# Patient Record
Sex: Male | Born: 1990 | Race: Black or African American | Hispanic: No | Marital: Single
Health system: Midwestern US, Community
[De-identification: ages and names within clinical notes are randomized; demographics above are authoritative.]

## PROBLEM LIST (undated history)

## (undated) DIAGNOSIS — G809 Cerebral palsy, unspecified: Secondary | ICD-10-CM

---

## 2009-10-29 ENCOUNTER — Emergency Department (HOSPITAL_BASED_OUTPATIENT_CLINIC_OR_DEPARTMENT_OTHER): Admission: EM | Admit: 2009-10-29 | Discharge: 2009-10-29 | Payer: Self-pay | Admitting: Emergency Medicine

## 2010-03-16 LAB — RAPID STREP SCREEN (MED CTR MEBANE ONLY): Streptococcus, Group A Screen (Direct): NEGATIVE

## 2010-08-21 ENCOUNTER — Emergency Department (HOSPITAL_BASED_OUTPATIENT_CLINIC_OR_DEPARTMENT_OTHER)
Admission: EM | Admit: 2010-08-21 | Discharge: 2010-08-21 | Disposition: A | Discharge status: Home / Self Care | Payer: Self-pay | Attending: Emergency Medicine | Admitting: Emergency Medicine

## 2010-08-21 ENCOUNTER — Encounter: Payer: Self-pay | Admitting: *Deleted

## 2010-08-21 ENCOUNTER — Emergency Department (INDEPENDENT_AMBULATORY_CARE_PROVIDER_SITE_OTHER): Payer: Self-pay

## 2010-08-21 DIAGNOSIS — R05 Cough: Secondary | ICD-10-CM | POA: Insufficient documentation

## 2010-08-21 DIAGNOSIS — F17200 Nicotine dependence, unspecified, uncomplicated: Secondary | ICD-10-CM | POA: Insufficient documentation

## 2010-08-21 DIAGNOSIS — J45909 Unspecified asthma, uncomplicated: Secondary | ICD-10-CM | POA: Insufficient documentation

## 2010-08-21 DIAGNOSIS — R059 Cough, unspecified: Secondary | ICD-10-CM | POA: Insufficient documentation

## 2010-08-21 MED ORDER — PREDNISONE 20 MG PO TABS
ORAL_TABLET | ORAL | Status: AC
  Administered 2010-08-21: 60 mg via ORAL
  Filled 2010-08-21: qty 3

## 2010-08-21 MED ORDER — ALBUTEROL SULFATE HFA 108 (90 BASE) MCG/ACT IN AERS
2.0000 | INHALATION_SPRAY | Freq: Once | RESPIRATORY_TRACT | Status: AC
  Administered 2010-08-21: 2 via RESPIRATORY_TRACT
  Filled 2010-08-21: qty 6.7

## 2010-08-21 MED ORDER — ALBUTEROL SULFATE (5 MG/ML) 0.5% IN NEBU
5.0000 mg | INHALATION_SOLUTION | RESPIRATORY_TRACT | Status: DC
  Administered 2010-08-21: 5 mg via RESPIRATORY_TRACT
  Filled 2010-08-21: qty 1

## 2010-08-21 MED ORDER — PREDNISONE 20 MG PO TABS
60.0000 mg | ORAL_TABLET | Freq: Every day | ORAL | Status: DC
  Administered 2010-08-21: 60 mg via ORAL

## 2010-08-21 MED ORDER — IPRATROPIUM BROMIDE 0.02 % IN SOLN
0.5000 mg | RESPIRATORY_TRACT | Status: DC
  Administered 2010-08-21: 0.5 mg via RESPIRATORY_TRACT
  Filled 2010-08-21: qty 2.5

## 2010-08-21 NOTE — ED Provider Notes (Signed)
History     CSN: [id number] Arrival date & time: 08/21/2010 11:47 AM  Chief Complaint  Patient presents with  . Cough   HPI Patient is a 20 year old male comes in today complaining of cough for week. He states he has had some sputum productive with green color. He is a smoker and smokes between about 5 cigarettes per day. He endorses some dyspnea upon questioning. He has not had any fever or chills. He has no history of asthma in the past. History reviewed. No pertinent past medical history.  History reviewed. No pertinent past surgical history.  History reviewed. No pertinent family history.  History  Substance Use Topics  . Smoking status: Current Everyday Smoker -- 0.2 packs/day    Types: Cigarettes  . Smokeless tobacco: Not on file  . Alcohol Use: Yes     maybe twice a year.     review  Review of Systems  All other systems reviewed and are negative.    Physical Exam  BP 147/87  Pulse 70  Temp(Src) 98.1 F (36.7 C) (Oral)  Resp 20  Ht 5\' 8"  (1.727 m)  Wt 243 lb (110.224 kg)  BMI 36.95 kg/m2  SpO2 95% Obese black male sitting on the bed in no acute distress his systolic blood pressure is slightly elevated at 147/87 he is afebrile his respiratory rate is 20. His heart rate is normal at 70. HEENT normocephalic atraumatic eyes are equal round Extraocular movements are intact. Ears tympanic membranes are normal bilaterally. Nares are patent with no discharge noted. Oropharynx-mucous membranes are moist. Oropharynx is clear no erythema or exudate is noted. Neck-trachea is midline. Carotid pulses are normal bilaterally. Neck is supple. There is no lymphadenopathy noted. Chest wall it-normal. Lungs-mild diffuse expiratory wheezing throughout. No rhonchi are noted.  Heart-regular rate and rhythm no murmurs or gallops are appreciated. Abdomen is soft obese and nontender. Extremities show no signs of edema or redness. Physical Exam  ED Course   Procedures  MDM Patient is given albuterol and Atrovent. He has decreased in wheezing and coughing. His chest x-Jedi Catalfamo does not show any focal infiltrate. He will be given prednisone and albuterol inhaler here. He is instructed to use this 2 puffs every 4 hours. He'll be given a prescription for prednisone. He is advised of the dangers of smoking and to stop smoking cigarettes.      Hilario Quarry, MD 08/24/10 [phone]

## 2010-08-21 NOTE — ED Notes (Signed)
Albuterol 2puffs given at this time using aerochamber.  Pt demostrates proper use.  RX label filled out and attached.

## 2010-08-21 NOTE — ED Notes (Signed)
Patient states he is coughing and it looks green with no known fever x 1 1/2 week and with some SOB. No acute respiratory distress notes at this time.

## 2011-08-19 ENCOUNTER — Emergency Department (HOSPITAL_BASED_OUTPATIENT_CLINIC_OR_DEPARTMENT_OTHER)
Admission: EM | Admit: 2011-08-19 | Discharge: 2011-08-19 | Disposition: A | Discharge status: Home / Self Care | Payer: Self-pay | Attending: Emergency Medicine | Admitting: Emergency Medicine

## 2011-08-19 ENCOUNTER — Encounter (HOSPITAL_BASED_OUTPATIENT_CLINIC_OR_DEPARTMENT_OTHER): Payer: Self-pay | Admitting: Emergency Medicine

## 2011-08-19 ENCOUNTER — Emergency Department (HOSPITAL_BASED_OUTPATIENT_CLINIC_OR_DEPARTMENT_OTHER): Payer: Self-pay

## 2011-08-19 DIAGNOSIS — M25569 Pain in unspecified knee: Secondary | ICD-10-CM | POA: Insufficient documentation

## 2011-08-19 DIAGNOSIS — G809 Cerebral palsy, unspecified: Secondary | ICD-10-CM | POA: Insufficient documentation

## 2011-08-19 DIAGNOSIS — Z880 Allergy status to penicillin: Secondary | ICD-10-CM | POA: Insufficient documentation

## 2011-08-19 HISTORY — DX: Cerebral palsy, unspecified: G80.9

## 2011-08-19 MED ORDER — IBUPROFEN 800 MG PO TABS
800.0000 mg | ORAL_TABLET | Freq: Once | ORAL | Status: AC
  Administered 2011-08-19: 800 mg via ORAL
  Filled 2011-08-19: qty 1

## 2011-08-19 MED ORDER — IBUPROFEN 800 MG PO TABS: 800.0000 mg | ORAL_TABLET | Freq: Three times a day (TID) | ORAL | Status: AC | PRN

## 2011-08-19 NOTE — ED Notes (Signed)
Pt states he has chronic right knee pain but has been worse in last few days.  No known injury.

## 2011-08-19 NOTE — ED Provider Notes (Addendum)
History     CSN: [id number]  Arrival date & time 08/19/11  1046   First MD Initiated Contact with Patient 08/19/11 1059      Chief Complaint  Patient presents with  . Knee Pain    (Consider location/radiation/quality/duration/timing/severity/associated sxs/prior treatment) HPI Pt with a history of mild cerebral palsy reports occasional aching pain in R leg. Since last night when he sat in a cross-legged position for a while, he has had moderate aching pain in R knee, worse with movement. No falls or trauma.  Past Medical History  Diagnosis Date  . Cerebral palsy     No past surgical history on file.  No family history on file.  History  Substance Use Topics  . Smoking status: Current Everyday Smoker -- 0.2 packs/day    Types: Cigarettes  . Smokeless tobacco: Not on file  . Alcohol Use: Yes     maybe twice a year.      Review of Systems All other systems reviewed and are negative except as noted in HPI.   Allergies  Penicillins  Home Medications  No current outpatient prescriptions on file.  BP 134/74  Pulse 71  Temp 97.8 F (36.6 C) (Oral)  Resp 18  SpO2 100%  Physical Exam  Nursing note and vitals reviewed. Constitutional: He is oriented to person, place, and time. He appears well-developed and well-nourished.  HENT:  Head: Normocephalic and atraumatic.  Eyes: EOM are normal. Pupils are equal, round, and reactive to light.  Neck: Normal range of motion. Neck supple.  Cardiovascular: Normal rate, normal heart sounds and intact distal pulses.   Pulmonary/Chest: Effort normal and breath sounds normal.  Abdominal: Bowel sounds are normal. He exhibits no distension. There is no tenderness.  Musculoskeletal: He exhibits tenderness. He exhibits no edema.       Pain in R knee, worse with ROM, decreased flexion, normal extension, no instability  Neurological: He is alert and oriented to person, place, and time. He has normal strength. No cranial nerve deficit  or sensory deficit.  Skin: Skin is warm and dry. No rash noted.  Psychiatric: He has a normal mood and affect.    ED Course  Procedures (including critical care time)  Labs Reviewed - No data to display Dg Knee Complete 4 Views Right  08/19/2011  *RADIOLOGY REPORT*  Clinical Data: Pain  RIGHT KNEE - COMPLETE 4+ VIEW  Comparison: None.  Findings: No acute fracture and no dislocation.  Unremarkable soft tissues.  IMPRESSION: No acute bony pathology.  Original Report Authenticated By: Donavan Burnet, M.D.     No diagnosis found.    MDM  Xray neg as above. Will give knee sleeve, pain medications as needed, [place] followup if needed.        Sindy Mccune B. Bernette Mayers, MD 08/19/11 1155

## 2013-05-19 ENCOUNTER — Emergency Department (HOSPITAL_BASED_OUTPATIENT_CLINIC_OR_DEPARTMENT_OTHER)
Admission: EM | Admit: 2013-05-19 | Discharge: 2013-05-20 | Disposition: A | Discharge status: Home / Self Care | Payer: BC Managed Care – PPO | Attending: Emergency Medicine | Admitting: Emergency Medicine

## 2013-05-19 ENCOUNTER — Encounter (HOSPITAL_BASED_OUTPATIENT_CLINIC_OR_DEPARTMENT_OTHER): Payer: Self-pay | Admitting: Emergency Medicine

## 2013-05-19 DIAGNOSIS — K219 Gastro-esophageal reflux disease without esophagitis: Secondary | ICD-10-CM

## 2013-05-19 DIAGNOSIS — Z880 Allergy status to penicillin: Secondary | ICD-10-CM | POA: Insufficient documentation

## 2013-05-19 DIAGNOSIS — F17200 Nicotine dependence, unspecified, uncomplicated: Secondary | ICD-10-CM | POA: Insufficient documentation

## 2013-05-19 DIAGNOSIS — Z8669 Personal history of other diseases of the nervous system and sense organs: Secondary | ICD-10-CM | POA: Insufficient documentation

## 2013-05-19 MED ORDER — GI COCKTAIL ~~LOC~~
30.0000 mL | Freq: Once | ORAL | Status: AC
  Administered 2013-05-20: 30 mL via ORAL
  Filled 2013-05-19: qty 30

## 2013-05-19 NOTE — ED Provider Notes (Signed)
CSN: [id number]     Arrival date & time 05/19/13  2115 History   First MD Initiated Contact with Patient 05/19/13 2341     Chief Complaint  Patient presents with  . Gastrophageal Reflux     (Consider location/radiation/quality/duration/timing/severity/associated sxs/prior Treatment) Patient is a 23 y.o. male presenting with GERD. The history is provided by the patient. No language interpreter was used.  Gastrophageal Reflux This is a new problem. The current episode started today. Pertinent negatives include no chest pain, congestion, fever, nausea, sore throat or vomiting. Associated symptoms comments: He has a burning sensation in his throat with a sense of swelling. He denies sore throat, fever, congestion. He states the sensation is constant but worse when he eats anything. . The symptoms are aggravated by eating.    Past Medical History  Diagnosis Date  . Cerebral palsy    History reviewed. No pertinent past surgical history. History reviewed. No pertinent family history. History  Substance Use Topics  . Smoking status: Current Every Day Smoker -- 0.20 packs/day    Types: Cigarettes  . Smokeless tobacco: Not on file  . Alcohol Use: Yes     Comment: maybe twice a year.    Review of Systems  Constitutional: Negative for fever.  HENT: Negative for congestion and sore throat.        See HPI.  Respiratory: Negative for shortness of breath.   Cardiovascular: Negative for chest pain.  Gastrointestinal: Negative for nausea and vomiting.      Allergies  Penicillins  Home Medications   Prior to Admission medications   Not on File   BP 127/79  Pulse 74  Temp(Src) 98.6 F (37 C) (Oral)  Resp 18  Ht 5\' 7"  (1.702 m)  Wt 243 lb (110.224 kg)  BMI 38.05 kg/m2  SpO2 98% Physical Exam  Constitutional: He is oriented to person, place, and time. He appears well-developed and well-nourished. No distress.  HENT:  Mouth/Throat: Oropharynx is clear and moist.  Neck: Normal  range of motion. No thyromegaly present.  Pulmonary/Chest: Effort normal. He has no wheezes. He has no rales. He exhibits no tenderness.  Abdominal: Soft. There is no tenderness. There is no rebound.  Neurological: He is alert and oriented to person, place, and time.    ED Course  Procedures (including critical care time) Labs Review Labs Reviewed - No data to display  Imaging Review No results found.   EKG Interpretation   Date/Time:  Monday May 19 2013 21:26:52 EDT Ventricular Rate:  70 PR Interval:  146 QRS Duration: 90 QT Interval:  368 QTC Calculation: 397 R Axis:   39 Text Interpretation:  Normal sinus rhythm with sinus arrhythmia Normal ECG  No old tracing to compare Confirmed by BELFI  MD, MELANIE ([id number]) on  05/19/2013 10:12:03 PM      MDM   Final diagnoses:  None    1. GERD  He is feeling better with GI Cocktail. No fever, abdomen non-tender, lungs clear. Doubt acute process. Will treat with Prilosec and encourage PCP follow up.    Arnoldo HookerShari A Clifford Coudriet, PA-C 05/20/13 [phone]

## 2013-05-19 NOTE — ED Notes (Signed)
C/o burning from chest to throat since 2 pm, denies diaphoresis, no N/V, no SOB

## 2013-05-20 MED ORDER — OMEPRAZOLE 20 MG PO CPDR: DELAYED_RELEASE_CAPSULE | ORAL | Status: DC

## 2013-05-20 NOTE — ED Provider Notes (Signed)
Medical screening examination/treatment/procedure(s) were performed by non-physician practitioner and as supervising physician I was immediately available for consultation/collaboration.   EKG Interpretation   Date/Time:  Monday May 19 2013 21:26:52 EDT Ventricular Rate:  70 PR Interval:  146 QRS Duration: 90 QT Interval:  368 QTC Calculation: 397 R Axis:   39 Text Interpretation:  Normal sinus rhythm with sinus arrhythmia Normal ECG  No old tracing to compare Confirmed by BELFI  MD, MELANIE ([id number]) on  05/19/2013 10:12:03 PM       Dale Ribeiro Smitty CordsK Dulce Martian-Rasch, MD 05/20/13 [id number]

## 2013-05-20 NOTE — Discharge Instructions (Signed)
Diet for Gastroesophageal Reflux Disease, Adult Reflux (acid reflux) is when acid from your stomach flows up into the esophagus. When acid comes in contact with the esophagus, the acid causes irritation and soreness (inflammation) in the esophagus. When reflux happens often or so severely that it causes damage to the esophagus, it is called gastroesophageal reflux disease (GERD). Nutrition therapy can help ease the discomfort of GERD. FOODS OR DRINKS TO AVOID OR LIMIT  Smoking or chewing tobacco. Nicotine is one of the most potent stimulants to acid production in the gastrointestinal tract.  Caffeinated and decaffeinated coffee and black tea.  Regular or low-calorie carbonated beverages or energy drinks (caffeine-free carbonated beverages are allowed).   Strong spices, such as black pepper, white pepper, red pepper, cayenne, curry powder, and chili powder.  Peppermint or spearmint.  Chocolate.  High-fat foods, including meats and fried foods. Extra added fats including oils, butter, salad dressings, and nuts. Limit these to less than 8 tsp per day.  Fruits and vegetables if they are not tolerated, such as citrus fruits or tomatoes.  Alcohol.  Any food that seems to aggravate your condition. If you have questions regarding your diet, call your caregiver or a registered dietitian. OTHER THINGS THAT MAY HELP GERD INCLUDE:   Eating your meals slowly, in a relaxed setting.  Eating 5 to 6 small meals per day instead of 3 large meals.  Eliminating food for a period of time if it causes distress.  Not lying down until 3 hours after eating a meal.  Keeping the head of your bed raised 6 to 9 inches (15 to 23 cm) by using a foam wedge or blocks under the legs of the bed. Lying flat may make symptoms worse.  Being physically active. Weight loss may be helpful in reducing reflux in overweight or obese adults.  Wear loose fitting clothing EXAMPLE MEAL PLAN This meal plan is approximately  2,000 calories based on [employer] meal planning guidelines. Breakfast   cup cooked oatmeal.  1 cup strawberries.  1 cup low-fat milk.  1 oz almonds. Snack  1 cup cucumber slices.  6 oz yogurt (made from low-fat or fat-free milk). Lunch  2 slice whole-wheat bread.  2 oz sliced turkey.  2 tsp mayonnaise.  1 cup blueberries.  1 cup snap peas. Snack  6 whole-wheat crackers.  1 oz string cheese. Dinner   cup brown rice.  1 cup mixed veggies.  1 tsp olive oil.  3 oz grilled fish. Document Released: 12/19/2004 Document Revised: 03/13/2011 Document Reviewed: 11/04/2010 [employer] Patient Information 2014 [employer]. Gastroesophageal Reflux Disease, Adult Gastroesophageal reflux disease (GERD) happens when acid from your stomach flows up into the esophagus. When acid comes in contact with the esophagus, the acid causes soreness (inflammation) in the esophagus. Over time, GERD may create small holes (ulcers) in the lining of the esophagus. CAUSES   Increased body weight. This puts pressure on the stomach, making acid rise from the stomach into the esophagus.  Smoking. This increases acid production in the stomach.  Drinking alcohol. This causes decreased pressure in the lower esophageal sphincter (valve or ring of muscle between the esophagus and stomach), allowing acid from the stomach into the esophagus.  Late evening meals and a full stomach. This increases pressure and acid production in the stomach.  A malformed lower esophageal sphincter. Sometimes, no cause is found. SYMPTOMS   Burning pain in the lower part of the mid-chest behind the breastbone and in the mid-stomach area. This may   occur twice a week or more often.  Trouble swallowing.  Sore throat.  Dry cough.  Asthma-like symptoms including chest tightness, shortness of breath, or wheezing. DIAGNOSIS  Your caregiver may be able to diagnose GERD based on your symptoms. In some cases,  X-rays and other tests may be done to check for complications or to check the condition of your stomach and esophagus. TREATMENT  Your caregiver may recommend over-the-counter or prescription medicines to help decrease acid production. Ask your caregiver before starting or adding any new medicines.  HOME CARE INSTRUCTIONS   Change the factors that you can control. Ask your caregiver for guidance concerning weight loss, quitting smoking, and alcohol consumption.  Avoid foods and drinks that make your symptoms worse, such as:  Caffeine or alcoholic drinks.  Chocolate.  Peppermint or mint flavorings.  Garlic and onions.  Spicy foods.  Citrus fruits, such as oranges, lemons, or limes.  Tomato-based foods such as sauce, chili, salsa, and pizza.  Fried and fatty foods.  Avoid lying down for the 3 hours prior to your bedtime or prior to taking a nap.  Eat small, frequent meals instead of large meals.  Wear loose-fitting clothing. Do not wear anything tight around your waist that causes pressure on your stomach.  Raise the head of your bed 6 to 8 inches with wood blocks to help you sleep. Extra pillows will not help.  Only take over-the-counter or prescription medicines for pain, discomfort, or fever as directed by your caregiver.  Do not take aspirin, ibuprofen, or other nonsteroidal anti-inflammatory drugs (NSAIDs). SEEK IMMEDIATE MEDICAL CARE IF:   You have pain in your arms, neck, jaw, teeth, or back.  Your pain increases or changes in intensity or duration.  You develop nausea, vomiting, or sweating (diaphoresis).  You develop shortness of breath, or you faint.  Your vomit is green, yellow, black, or looks like coffee grounds or blood.  Your stool is red, bloody, or black. These symptoms could be signs of other problems, such as heart disease, gastric bleeding, or esophageal bleeding. MAKE SURE YOU:   Understand these instructions.  Will watch your  condition.  Will get help right away if you are not doing well or get worse. Document Released: 09/28/2004 Document Revised: 03/13/2011 Document Reviewed: 07/08/2010 Exit[employer] Patient Information 2014 [employer].  

## 2014-06-26 ENCOUNTER — Encounter (HOSPITAL_BASED_OUTPATIENT_CLINIC_OR_DEPARTMENT_OTHER): Payer: Self-pay | Admitting: Emergency Medicine

## 2014-06-26 ENCOUNTER — Emergency Department (HOSPITAL_BASED_OUTPATIENT_CLINIC_OR_DEPARTMENT_OTHER)
Admission: EM | Admit: 2014-06-26 | Discharge: 2014-06-26 | Disposition: A | Discharge status: Home / Self Care | Payer: 59 | Attending: Emergency Medicine | Admitting: Emergency Medicine

## 2014-06-26 DIAGNOSIS — Z79899 Other long term (current) drug therapy: Secondary | ICD-10-CM | POA: Insufficient documentation

## 2014-06-26 DIAGNOSIS — Z880 Allergy status to penicillin: Secondary | ICD-10-CM | POA: Insufficient documentation

## 2014-06-26 DIAGNOSIS — J029 Acute pharyngitis, unspecified: Secondary | ICD-10-CM | POA: Diagnosis present

## 2014-06-26 DIAGNOSIS — Z8669 Personal history of other diseases of the nervous system and sense organs: Secondary | ICD-10-CM | POA: Insufficient documentation

## 2014-06-26 DIAGNOSIS — Z720 Tobacco use: Secondary | ICD-10-CM | POA: Insufficient documentation

## 2014-06-26 DIAGNOSIS — J020 Streptococcal pharyngitis: Secondary | ICD-10-CM | POA: Insufficient documentation

## 2014-06-26 DIAGNOSIS — R000 Tachycardia, unspecified: Secondary | ICD-10-CM | POA: Insufficient documentation

## 2014-06-26 LAB — RAPID STREP SCREEN (MED CTR MEBANE ONLY): Streptococcus, Group A Screen (Direct): POSITIVE — AB

## 2014-06-26 MED ORDER — IBUPROFEN 800 MG PO TABS
800.0000 mg | ORAL_TABLET | Freq: Once | ORAL | Status: AC
  Administered 2014-06-26: 800 mg via ORAL
  Filled 2014-06-26: qty 1

## 2014-06-26 MED ORDER — CLINDAMYCIN HCL 300 MG PO CAPS: 300.0000 mg | ORAL_CAPSULE | Freq: Three times a day (TID) | ORAL | Status: DC

## 2014-06-26 MED ORDER — CLINDAMYCIN HCL 150 MG PO CAPS
300.0000 mg | ORAL_CAPSULE | Freq: Once | ORAL | Status: AC
  Administered 2014-06-26: 300 mg via ORAL
  Filled 2014-06-26: qty 2

## 2014-06-26 MED ORDER — LIDOCAINE VISCOUS 2 % MT SOLN
15.0000 mL | Freq: Once | OROMUCOSAL | Status: AC
  Administered 2014-06-26: 15 mL via OROMUCOSAL
  Filled 2014-06-26: qty 15

## 2014-06-26 MED ORDER — IBUPROFEN 800 MG PO TABS: 800.0000 mg | ORAL_TABLET | Freq: Three times a day (TID) | ORAL | Status: DC

## 2014-06-26 NOTE — Discharge Instructions (Signed)
Take motrin for pain.  Take clindamycin three times daily for a week.   Stay hydrated.   Follow up with your doctor.   Return to ER if you have worse sore throat, fever, trouble swallowing.

## 2014-06-26 NOTE — ED Provider Notes (Signed)
CSN: [id number]     Arrival date & time 06/26/14  9381 History   First MD Initiated Contact with Patient 06/26/14 0515     Chief Complaint  Patient presents with  . Sore Throat     (Consider location/radiation/quality/duration/timing/severity/associated sxs/prior Treatment) The history is provided by the patient.  Grant Brown is a 24 y.o. male hx of cerebral palsy here with sore throat. Patient has been having sore throat for the last 2 days. Patient has subjective fevers as well. Took some TheraFlu and Motrin with minimal relief. Denies any cough. Has some intermittent headaches but denies any neck stiffness. Denies any sick contacts.   Past Medical History  Diagnosis Date  . Cerebral palsy    History reviewed. No pertinent past surgical history. No family history on file. History  Substance Use Topics  . Smoking status: Current Every Day Smoker -- 0.20 packs/day    Types: Cigarettes  . Smokeless tobacco: Not on file  . Alcohol Use: Yes     Comment: maybe twice a year.    Review of Systems  HENT: Positive for sore throat.   All other systems reviewed and are negative.     Allergies  Penicillins  Home Medications   Prior to Admission medications   Medication Sig Start Date End Date Taking? Authorizing Provider  omeprazole (PRILOSEC) 20 MG capsule Take one twice daily for the first 5 days then once daily after that 05/20/13   Melvenia Beam Upstill, PA-C   BP 147/85 mmHg  Pulse 108  Temp(Src) 99.6 F (37.6 C) (Oral)  Resp 20  Ht 5\' 7"  (1.702 m)  Wt 220 lb (99.791 kg)  BMI 34.45 kg/m2  SpO2 98% Physical Exam  Constitutional: He is oriented to person, place, and time.  Uncomfortable   HENT:  Head: Normocephalic.  OP red. No obvious tonsillar exudates   Eyes: Conjunctivae are normal. Pupils are equal, round, and reactive to light.  Neck:  Mild diffuse cervical LAD   Cardiovascular: Regular rhythm and normal heart sounds.   Mildly tachy   Pulmonary/Chest: Effort  normal and breath sounds normal. No respiratory distress. He has no wheezes. He has no rales.  Abdominal: Soft. Bowel sounds are normal. He exhibits no distension. There is no tenderness. There is no rebound and no guarding.  Musculoskeletal: Normal range of motion. He exhibits no edema or tenderness.  Neurological: He is alert and oriented to person, place, and time. No cranial nerve deficit. Coordination normal.  Skin: Skin is warm and dry.  Psychiatric: He has a normal mood and affect. His behavior is normal. Judgment and thought content normal.  Nursing note and vitals reviewed.   ED Course  Procedures (including critical care time) Labs Review Labs Reviewed  RAPID STREP SCREEN (NOT AT [facility]) - Abnormal; Notable for the following:    Streptococcus, Group A Screen (Direct) POSITIVE (*)    All other components within normal limits    Imaging Review No results found.   EKG Interpretation None      MDM   Final diagnoses:  None    Grant Brown is a 24 y.o. male here with sore throat. Low grade temp, has cervical LAD, strep positive. Will give clinda (allergic to pcn). No signs of RPA or PTA. Will dc home.     Richardean Canal, MD 06/26/14 [phone]

## 2014-06-26 NOTE — ED Notes (Signed)
Patient c/o sore throat x 2 days; states has been taking Theraflu and Motrin.  Also, c/o headaches.

## 2014-12-14 ENCOUNTER — Emergency Department (HOSPITAL_BASED_OUTPATIENT_CLINIC_OR_DEPARTMENT_OTHER): Payer: 59

## 2014-12-14 ENCOUNTER — Emergency Department (HOSPITAL_BASED_OUTPATIENT_CLINIC_OR_DEPARTMENT_OTHER)
Admission: EM | Admit: 2014-12-14 | Discharge: 2014-12-14 | Disposition: A | Discharge status: Home / Self Care | Payer: 59 | Attending: Emergency Medicine | Admitting: Emergency Medicine

## 2014-12-14 ENCOUNTER — Encounter (HOSPITAL_BASED_OUTPATIENT_CLINIC_OR_DEPARTMENT_OTHER): Payer: Self-pay

## 2014-12-14 DIAGNOSIS — Z8669 Personal history of other diseases of the nervous system and sense organs: Secondary | ICD-10-CM | POA: Diagnosis not present

## 2014-12-14 DIAGNOSIS — Z791 Long term (current) use of non-steroidal anti-inflammatories (NSAID): Secondary | ICD-10-CM | POA: Diagnosis not present

## 2014-12-14 DIAGNOSIS — M25572 Pain in left ankle and joints of left foot: Secondary | ICD-10-CM | POA: Diagnosis present

## 2014-12-14 DIAGNOSIS — F17210 Nicotine dependence, cigarettes, uncomplicated: Secondary | ICD-10-CM | POA: Diagnosis not present

## 2014-12-14 DIAGNOSIS — Z880 Allergy status to penicillin: Secondary | ICD-10-CM | POA: Diagnosis not present

## 2014-12-14 MED ORDER — PREDNISONE 50 MG PO TABS: 50.0000 mg | ORAL_TABLET | Freq: Every day | ORAL | Status: DC

## 2014-12-14 MED ORDER — TRAMADOL HCL 50 MG PO TABS: 50.0000 mg | ORAL_TABLET | Freq: Four times a day (QID) | ORAL | Status: DC | PRN

## 2014-12-14 NOTE — ED Provider Notes (Signed)
CSN: [id number]     Arrival date & time 12/14/14  1522 History   First MD Initiated Contact with Patient 12/14/14 1601     Chief Complaint  Patient presents with  . Ankle Pain     (Consider location/radiation/quality/duration/timing/severity/associated sxs/prior Treatment) HPI Patient presents to the emergency department with left ankle pain has been ongoing over the last 3 weeks.  They should states that he does stand and walk a lot at work.  He states that he has had strep quite a while and never had this trouble before.  Patient states that nothing seems make her condition better.  He notes the pain is worse when he first wakes up and gets better as the day goes on.  Patient states nothing seems make her condition better or worse.  Patient states that since take any medications prior to arrival for his symptoms.  Patient denies numbness or weakness.  Past Medical History  Diagnosis Date  . Cerebral palsy (HCC)    History reviewed. No pertinent past surgical history. No family history on file. Social History  Substance Use Topics  . Smoking status: Current Every Day Smoker -- 0.20 packs/day    Types: Cigarettes  . Smokeless tobacco: None  . Alcohol Use: Yes     Comment: occ    Review of Systems All other systems negative except as documented in the HPI. All pertinent positives and negatives as reviewed in the HPI.  Allergies  Penicillins  Home Medications   Prior to Admission medications   Medication Sig Start Date End Date Taking? Authorizing Provider  ibuprofen (ADVIL,MOTRIN) 800 MG tablet Take 1 tablet (800 mg total) by mouth 3 (three) times daily. 06/26/14   Richardean Canalavid H Yao, MD   BP 128/71 mmHg  Pulse 84  Temp(Src) 98.4 F (36.9 C) (Oral)  Resp 16  Ht 5\' 8"  (1.727 m)  Wt 102.059 kg  BMI 34.22 kg/m2  SpO2 98% Physical Exam  Constitutional: He is oriented to person, place, and time. He appears well-developed and well-nourished. No distress.  HENT:  Head:  Normocephalic and atraumatic.  Eyes: Pupils are equal, round, and reactive to light.  Pulmonary/Chest: Effort normal.  Musculoskeletal:       Left ankle: He exhibits normal range of motion, no swelling, no ecchymosis, no deformity, no laceration and normal pulse. No tenderness. Achilles tendon normal. Achilles tendon exhibits no pain, no defect and normal Thompson's test results.  Neurological: He is oriented to person, place, and time.  Skin: Skin is warm and dry. No rash noted. No erythema.    ED Course  Procedures (including critical care time) Labs Review Labs Reviewed - No data to display  Imaging Review Dg Ankle Complete Left  12/14/2014  CLINICAL DATA:  Medial ankle pain for 2 or 3 months. No known injury. EXAM: LEFT ANKLE COMPLETE - 3+ VIEW COMPARISON:  None. FINDINGS: The mineralization and alignment are normal. There is no evidence of acute fracture or dislocation. The joint spaces appear maintained. Ossific density projecting medially from the talus inferior to the medial malleolus on the oblique view is probably a small spur, not seen on the other views. Hydroxyapatite deposition considered less likely. No focal soft tissue swelling identified. IMPRESSION: No acute osseous findings. Probable spurring inferior to the medial malleolus; hydroxyapatite deposition less likely. Electronically Signed   By: Carey BullocksWilliam  Veazey M.D.   On: 12/14/2014 16:49   I have personally reviewed and evaluated these images and lab results as part of my medical  decision-making.  Patient is placed in an ASO and referred to orthopedics.  Told ice and elevate his ankle.  Told to return here as needed.  Patient agrees the plan and all questions were answered    Charlestine Night, PA-C 12/15/14 2250  Nelva Nay, MD 12/16/14 [phone]

## 2014-12-14 NOTE — ED Notes (Signed)
C/o left ankle pain x 2-3 weeks-denies injury-NAD-steady gait

## 2014-12-14 NOTE — Discharge Instructions (Signed)
Return here as needed. Follow up with the orthopedist provided. Ice and elevate the ankle. °

## 2015-09-05 ENCOUNTER — Encounter (HOSPITAL_BASED_OUTPATIENT_CLINIC_OR_DEPARTMENT_OTHER): Payer: Self-pay | Admitting: Emergency Medicine

## 2015-09-05 DIAGNOSIS — J039 Acute tonsillitis, unspecified: Secondary | ICD-10-CM | POA: Insufficient documentation

## 2015-09-05 DIAGNOSIS — F17210 Nicotine dependence, cigarettes, uncomplicated: Secondary | ICD-10-CM | POA: Insufficient documentation

## 2015-09-05 LAB — RAPID STREP SCREEN (MED CTR MEBANE ONLY): STREPTOCOCCUS, GROUP A SCREEN (DIRECT): NEGATIVE

## 2015-09-05 NOTE — ED Triage Notes (Signed)
Pt c/o sore throat and HA since Thursday

## 2015-09-06 ENCOUNTER — Emergency Department (HOSPITAL_BASED_OUTPATIENT_CLINIC_OR_DEPARTMENT_OTHER)
Admission: EM | Admit: 2015-09-06 | Discharge: 2015-09-06 | Disposition: A | Discharge status: Home / Self Care | Payer: BLUE CROSS/BLUE SHIELD | Attending: Emergency Medicine | Admitting: Emergency Medicine

## 2015-09-06 DIAGNOSIS — J039 Acute tonsillitis, unspecified: Secondary | ICD-10-CM

## 2015-09-06 MED ORDER — AZITHROMYCIN 250 MG PO TABS: 500.0000 mg | ORAL_TABLET | Freq: Every day | ORAL | 0 refills | Status: AC

## 2015-09-06 NOTE — ED Provider Notes (Signed)
  [facility]   CSN: [id number] Arrival date & time: 09/05/15  2228     History   Chief Complaint Chief Complaint  Patient presents with  . Sore Throat    HPI Grant Brown is a 25 y.o. male with a five-day history of sore throat. He rates his pain as moderate to severe, pain is worse with swallowing. He has had an associated headache and low-grade fever. He denies nasal congestion, shortness of breath, cough, abdominal pain, nausea or vomiting. He has had diarrhea.  HPI  Past Medical History:  Diagnosis Date  . Cerebral palsy (HCC)     There are no active problems to display for this patient.   History reviewed. No pertinent surgical history.     Home Medications    Prior to Admission medications   Medication Sig Start Date End Date Taking? Authorizing Provider  ibuprofen (ADVIL,MOTRIN) 800 MG tablet Take 1 tablet (800 mg total) by mouth 3 (three) times daily. 06/26/14   Charlynne Panderavid Hsienta Yao, MD    Family History History reviewed. No pertinent family history.  Social History Social History  Substance Use Topics  . Smoking status: Current Every Day Smoker    Packs/day: 0.20    Types: Cigarettes  . Smokeless tobacco: Never Used  . Alcohol use Yes     Comment: occ     Allergies   Penicillins   Review of Systems Review of Systems  All other systems reviewed and are negative.   Physical Exam Updated Vital Signs BP 100/61 (BP Location: Right Arm)   Pulse 80   Temp 97.9 F (36.6 C) (Oral)   Resp 18   Ht 5\' 8"  (1.727 m)   Wt 225 lb (102.1 kg)   SpO2 97%   BMI 34.21 kg/m   Physical Exam General: Well-developed, well-nourished male in no acute distress; appearance consistent with age of record HENT: normocephalic; atraumatic; tonsils erythematous, enlarged with exudate; no dysphonia; no stridor; uvula midline; strep-like odor to breath Eyes: pupils equal, round and reactive to light; extraocular muscles intact Neck:  supple Heart: regular rate and rhythm Lungs: clear to auscultation bilaterally Abdomen: soft; nondistended; nontender; no masses or hepatosplenomegaly; bowel sounds present Extremities: No deformity; full range of motion; pulses normal Neurologic: Awake, alert and oriented; motor function intact in all extremities and symmetric; no facial droop Skin: Warm and dry Psychiatric: Flat affect    ED Treatments / Results   Nursing notes and vitals signs, including pulse oximetry, reviewed.  Summary of this visit's results, reviewed by myself:  Labs:  Results for orders placed or performed during the hospital encounter of 09/06/15 (from the past 24 hour(s))  Rapid strep screen     Status: None   Collection Time: 09/05/15 10:30 PM  Result Value Ref Range   Streptococcus, Group A Screen (Direct) NEGATIVE NEGATIVE    Procedures (including critical care time)   Final Clinical Impressions(s) / ED Diagnoses   Final diagnoses:  Tonsillitis      Paula LibraJohn Christene Pounds, MD 09/06/15 [phone]

## 2015-09-06 NOTE — ED Notes (Signed)
Pt given d/c instructions as per chart. Verbalizes understanding. No questions. Rx x 1 

## 2015-09-06 NOTE — ED Notes (Signed)
Sore throat since Wed.

## 2015-09-08 LAB — CULTURE, GROUP A STREP (THRC)

## 2016-04-13 ENCOUNTER — Emergency Department (HOSPITAL_BASED_OUTPATIENT_CLINIC_OR_DEPARTMENT_OTHER)
Admission: EM | Admit: 2016-04-13 | Discharge: 2016-04-13 | Disposition: A | Discharge status: Home / Self Care | Payer: BLUE CROSS/BLUE SHIELD | Attending: Emergency Medicine | Admitting: Emergency Medicine

## 2016-04-13 ENCOUNTER — Encounter (HOSPITAL_BASED_OUTPATIENT_CLINIC_OR_DEPARTMENT_OTHER): Payer: Self-pay | Admitting: *Deleted

## 2016-04-13 DIAGNOSIS — L089 Local infection of the skin and subcutaneous tissue, unspecified: Secondary | ICD-10-CM | POA: Insufficient documentation

## 2016-04-13 DIAGNOSIS — F17210 Nicotine dependence, cigarettes, uncomplicated: Secondary | ICD-10-CM | POA: Insufficient documentation

## 2016-04-13 DIAGNOSIS — J029 Acute pharyngitis, unspecified: Secondary | ICD-10-CM | POA: Insufficient documentation

## 2016-04-13 DIAGNOSIS — J020 Streptococcal pharyngitis: Secondary | ICD-10-CM

## 2016-04-13 LAB — RAPID STREP SCREEN (MED CTR MEBANE ONLY): Streptococcus, Group A Screen (Direct): POSITIVE — AB

## 2016-04-13 MED ORDER — ACETAMINOPHEN 325 MG PO TABS
650.0000 mg | ORAL_TABLET | Freq: Once | ORAL | Status: AC
  Administered 2016-04-13: 650 mg via ORAL
  Filled 2016-04-13: qty 2

## 2016-04-13 MED ORDER — CEPHALEXIN 500 MG PO CAPS: 500.0000 mg | ORAL_CAPSULE | Freq: Two times a day (BID) | ORAL | 0 refills | Status: AC

## 2016-04-13 MED ORDER — SULFAMETHOXAZOLE-TRIMETHOPRIM 800-160 MG PO TABS: 1.0000 | ORAL_TABLET | Freq: Two times a day (BID) | ORAL | 0 refills | Status: AC

## 2016-04-13 MED FILL — CEPHALEXIN 500 MG CAPSULE: 500 | 10 days supply | Qty: 20 | Fill #0

## 2016-04-13 MED FILL — SULFAMETHOXAZOLE/TMP DS TAB: 800-160 | 10 days supply | Qty: 20 | Fill #0

## 2016-04-13 NOTE — ED Triage Notes (Signed)
Pt reports sore throat x yesterday, also concerned about wound to his posterior left leg.

## 2016-04-13 NOTE — Discharge Instructions (Signed)
Stay well hydrated, take entire antibiotics even if you feel better. Ibuprofen for pain Follow up with your primary care provider as needed. Return to ED if you experience any new concerning symptoms.  Return to the emergency department if you experience worsening fever, chills, nausea, vomiting,

## 2016-04-13 NOTE — ED Provider Notes (Signed)
[facility]   CSN: [id number] Arrival date & time: 04/13/16  0955     History   Chief Complaint Chief Complaint  Patient presents with  . Sore Throat    HPI Grant Brown is a 26 y.o. male presenting with 1 day of sore throat which is worse when he talks or swallows. He states that he has taken ibuprofen with some relief. He denies any fever, chills, nausea, vomiting. He  also reported a lesion on his left lower leg which appeared 2 days ago he denies any trauma or scratching or insect bite.  HPI  Past Medical History:  Diagnosis Date  . Cerebral palsy (HCC)     There are no active problems to display for this patient.   History reviewed. No pertinent surgical history.     Home Medications    Prior to Admission medications   Medication Sig Start Date End Date Taking? Authorizing Provider  cephALEXin (KEFLEX) 500 MG capsule Take 1 capsule (500 mg total) by mouth 2 (two) times daily. 04/13/16 04/23/16  Georgiana Shore, PA-C  ibuprofen (ADVIL,MOTRIN) 800 MG tablet Take 1 tablet (800 mg total) by mouth 3 (three) times daily. 06/26/14   Charlynne Pander, MD  sulfamethoxazole-trimethoprim (BACTRIM DS,SEPTRA DS) 800-160 MG tablet Take 1 tablet by mouth 2 (two) times daily. 04/13/16 04/23/16  Georgiana Shore, PA-C    Family History History reviewed. No pertinent family history.  Social History Social History  Substance Use Topics  . Smoking status: Current Every Day Smoker    Packs/day: 0.20    Types: Cigarettes  . Smokeless tobacco: Never Used  . Alcohol use Yes     Comment: occ     Allergies   Penicillins   Review of Systems Review of Systems  Constitutional: Negative for chills and fever.  HENT: Positive for sore throat. Negative for ear pain.   Eyes: Negative for pain and visual disturbance.  Respiratory: Negative for cough, chest tightness, shortness of breath, wheezing and stridor.   Cardiovascular: Negative for chest  pain, palpitations and leg swelling.  Gastrointestinal: Negative for abdominal distention, abdominal pain, diarrhea, nausea and vomiting.  Genitourinary: Negative for dysuria and hematuria.  Musculoskeletal: Negative for arthralgias, back pain, gait problem, joint swelling, myalgias, neck pain and neck stiffness.  Skin: Positive for wound. Negative for color change, pallor and rash.  Neurological: Negative for dizziness, seizures, syncope and headaches.  All other systems reviewed and are negative.    Physical Exam Updated Vital Signs BP 134/88 (BP Location: Right Arm)   Pulse 89   Temp 99.1 F (37.3 C) (Oral)   Resp 16   Ht  (1.727 m)   Wt 108.9 kg   SpO2 97%   BMI 36.49 kg/m   Physical Exam  Constitutional: He appears well-developed and well-nourished. No distress.  Afebrile, nontoxic-appearing, sitting comfortably in chair in no acute distress. Patient wearing a mask and not speaking up initially. He shook his head that his throat hurts when he talks.  HENT:  Head: Normocephalic and atraumatic.  Eyes: Conjunctivae are normal.  Neck: Normal range of motion. Neck supple.  Cardiovascular: Normal rate, regular rhythm, normal heart sounds and intact distal pulses.   No murmur heard. Pulmonary/Chest: Effort normal and breath sounds normal. No stridor. No respiratory distress. He has no wheezes. He has no rales. He exhibits no tenderness.  Abdominal: He exhibits no distension.  Musculoskeletal: Normal range of motion. He exhibits no edema or deformity.  Lymphadenopathy:    He has no cervical adenopathy.  Neurological: He is alert.  Skin: Skin is warm and dry. No rash noted. He is not diaphoretic. No erythema. No pallor.  Left lower extremity erythema, warmth and central scarring.  Psychiatric: He has a normal mood and affect.  Nursing note and vitals reviewed.    ED Treatments / Results  Labs (all labs ordered are listed, but only abnormal results are displayed) Labs  Reviewed  RAPID STREP SCREEN (NOT AT [facility]) - Abnormal; Notable for the following:       Result Value   Streptococcus, Group A Screen (Direct) POSITIVE (*)    All other components within normal limits    EKG  EKG Interpretation None       Radiology No results found.  Procedures Procedures (including critical care time)  Medications Ordered in ED Medications - No data to display   Initial Impression / Assessment and Plan / ED Course  I have reviewed the triage vital signs and the nursing notes.  Pertinent labs & imaging results that were available during my care of the patient were reviewed by me and considered in my medical decision making (see chart for details).     Patient presents with 1 day of sore throat, no fever, chills nausea or vomiting. After discussing discharge plan and antibiotics, patient mentioned that he wanted me to look at a skin lesion on his leg.  1 cm area of scaling surrounded by erythema and warmth. No abscess noted amenable to drainage. Appears to have already drained and scarred over. No induration but warmth noted. Wound dressing applied and area of erythema marked with surgical pen.  Will discharge home with Keflex and Bactrim for both strep and skin infection. Patient unsure of reaction to penicillin states that it was a long time ago but denies remembering any life-threatening reaction.  DC home with close follow-up with PCP for wound recheck in 48 hours.  Discussed strict return precautions and advised to return to the emergency department if experiencing any new or worsening symptoms. Instructions were understood and patient agreed with discharge plan.  Final Clinical Impressions(s) / ED Diagnoses   Final diagnoses:  Strep pharyngitis  Skin infection    New Prescriptions New Prescriptions   CEPHALEXIN (KEFLEX) 500 MG CAPSULE    Take 1 capsule (500 mg total) by mouth 2 (two) times daily.   SULFAMETHOXAZOLE-TRIMETHOPRIM (BACTRIM  DS,SEPTRA DS) 800-160 MG TABLET    Take 1 tablet by mouth 2 (two) times daily.     Georgiana Shore, PA-C 04/13/16 1222    Alvira Monday, MD 04/14/16 [phone]

## 2016-11-20 ENCOUNTER — Emergency Department (HOSPITAL_BASED_OUTPATIENT_CLINIC_OR_DEPARTMENT_OTHER)
Admission: EM | Admit: 2016-11-20 | Discharge: 2016-11-20 | Disposition: A | Discharge status: Home / Self Care | Payer: BLUE CROSS/BLUE SHIELD | Attending: Emergency Medicine | Admitting: Emergency Medicine

## 2016-11-20 ENCOUNTER — Encounter (HOSPITAL_BASED_OUTPATIENT_CLINIC_OR_DEPARTMENT_OTHER): Payer: Self-pay | Admitting: Emergency Medicine

## 2016-11-20 ENCOUNTER — Other Ambulatory Visit: Payer: Self-pay

## 2016-11-20 DIAGNOSIS — K5900 Constipation, unspecified: Secondary | ICD-10-CM

## 2016-11-20 DIAGNOSIS — F17210 Nicotine dependence, cigarettes, uncomplicated: Secondary | ICD-10-CM | POA: Insufficient documentation

## 2016-11-20 NOTE — ED Triage Notes (Signed)
Pt states he has not had a "normal" BM for one week.  He has had small BMs.  Last BM last night.  No N/V/D.  No fever.  No changes in diet.  No new medications.

## 2016-11-20 NOTE — ED Provider Notes (Signed)
[facility] EMERGENCY DEPARTMENT Provider Note   CSN: [id number] Arrival date & time: 11/20/16  [id number]     History   Chief Complaint Chief Complaint  Patient presents with  . Constipation    HPI Grant CraverRobert Brown is a 26 y.o. male.  HPI  26 year old male presents today complaining of constipation.  He states that he has had a change in bowel habits with stools that are only occurring every other day as opposed to 2 times per day per his usual.  He states that his girlfriend is pregnant and wonders if it is related to this.  He denies any nausea, vomiting, diarrhea, rectal mass, abdominal pain.  He states that he has been eating his usual diet.  He states he does eat some fruit and drinks plenty of water's.  He tried an over-the-counter laxative twice a week ago and had bowel movement at that time.  Past Medical History:  Diagnosis Date  . Cerebral palsy (HCC)     There are no active problems to display for this patient.   No past surgical history on file.     Home Medications    Prior to Admission medications   Not on File    Family History No family history on file.  Social History Social History   Tobacco Use  . Smoking status: Current Every Day Smoker    Packs/day: 0.20    Types: Cigarettes  . Smokeless tobacco: Never Used  Substance Use Topics  . Alcohol use: Yes    Comment: occ  . Drug use: No     Allergies   Penicillins   Review of Systems Review of Systems  All other systems reviewed and are negative.    Physical Exam Updated Vital Signs BP 126/72   Pulse 67   Temp 98.3 F (36.8 C)   Ht 1.727 m (5\' 8" )   Wt 108.9 kg (240 lb)   SpO2 96%   BMI 36.49 kg/m   Physical Exam  Constitutional: He is oriented to person, place, and time. He appears well-developed.  Obese  HENT:  Head: Normocephalic and atraumatic.  Right Ear: External ear normal.  Left Ear: External ear normal.  Nose: Nose normal.  Eyes: EOM are normal.  Neck:  No tracheal deviation present.  Cardiovascular: Normal rate and regular rhythm.  Pulmonary/Chest: Effort normal.  Abdominal: Soft. Bowel sounds are normal. He exhibits no distension and no mass. There is no tenderness. There is no rebound and no guarding. No hernia.  Musculoskeletal: Normal range of motion.  Neurological: He is alert and oriented to person, place, and time.  Skin: Skin is warm and dry.  Psychiatric: He has a normal mood and affect. His behavior is normal.  Nursing note and vitals reviewed.    ED Treatments / Results  Labs (all labs ordered are listed, but only abnormal results are displayed) Labs Reviewed - No data to display  EKG  EKG Interpretation None       Radiology No results found.  Procedures Procedures (including critical care time)  Medications Ordered in ED Medications - No data to display   Initial Impression / Assessment and Plan / ED Course  I have reviewed the triage vital signs and the nursing notes.  Pertinent labs & imaging results that were available during my care of the patient were reviewed by me and considered in my medical decision making (see chart for details).     Patient counseled regarding constipation and conservative therapy including  adequate water intake, stool bulking agents, and exercise.  He has a primary care physician and is encouraged to follow-up with his primary care physician if he continues to have any problems.  Final Clinical Impressions(s) / ED Diagnoses   Final diagnoses:  Constipation, unspecified constipation type    ED Discharge Orders    None       Margarita Grizzleay, Crescencio Jozwiak, MD 11/20/16 [phone]

## 2017-05-11 ENCOUNTER — Encounter (HOSPITAL_BASED_OUTPATIENT_CLINIC_OR_DEPARTMENT_OTHER): Payer: Self-pay | Admitting: Emergency Medicine

## 2017-05-11 ENCOUNTER — Emergency Department (HOSPITAL_BASED_OUTPATIENT_CLINIC_OR_DEPARTMENT_OTHER)
Admission: EM | Admit: 2017-05-11 | Discharge: 2017-05-11 | Disposition: A | Discharge status: Home / Self Care | Payer: Self-pay | Attending: Physician Assistant | Admitting: Physician Assistant

## 2017-05-11 ENCOUNTER — Other Ambulatory Visit: Payer: Self-pay

## 2017-05-11 ENCOUNTER — Emergency Department (HOSPITAL_BASED_OUTPATIENT_CLINIC_OR_DEPARTMENT_OTHER): Payer: Self-pay

## 2017-05-11 DIAGNOSIS — K5900 Constipation, unspecified: Secondary | ICD-10-CM | POA: Insufficient documentation

## 2017-05-11 DIAGNOSIS — G809 Cerebral palsy, unspecified: Secondary | ICD-10-CM | POA: Insufficient documentation

## 2017-05-11 DIAGNOSIS — F17210 Nicotine dependence, cigarettes, uncomplicated: Secondary | ICD-10-CM | POA: Insufficient documentation

## 2017-05-11 MED ORDER — POLYETHYLENE GLYCOL 3350 17 GM/SCOOP PO POWD: 17.0000 g | Freq: Every day | ORAL | 1 refills | Status: AC

## 2017-05-11 MED ORDER — POLYETHYLENE GLYCOL 3350 17 GM/SCOOP PO POWD: 17.0000 g | Freq: Every day | ORAL | 0 refills | Status: DC

## 2017-05-11 MED ORDER — SENNOSIDES-DOCUSATE SODIUM 8.6-50 MG PO TABS: 1.0000 | ORAL_TABLET | Freq: Every day | ORAL | 1 refills | Status: AC

## 2017-05-11 MED ORDER — SENNOSIDES-DOCUSATE SODIUM 8.6-50 MG PO TABS: 1.0000 | ORAL_TABLET | Freq: Every day | ORAL | 0 refills | Status: DC

## 2017-05-11 MED FILL — SM STOOL SOFTENER-LAXATIVE: 8.6-50 | 100 days supply | Qty: 100 | Fill #0

## 2017-05-11 MED FILL — SM CLEARLAX POWDER: 14 days supply | Qty: 238 | Fill #0

## 2017-05-11 NOTE — ED Provider Notes (Signed)
[facility] EMERGENCY DEPARTMENT Provider Note   CSN: [id number] Arrival date & time: 05/11/17  1141   History   Chief Complaint Chief Complaint  Patient presents with  . Constipation    HPI Grant Brown is a 27 y.o. male with no PMH presenting to the ED with constipation and left sided abdominal pain for the last couple of months. The pain has been constant and feels like "a pain that's there". He cannot describe the pain any more than that. He states he was previously having 2-3 BMs per day, but over the last couple of months, he is having 1 BM every 2 days. He states that he was previously drinking tea, coffee, and water, which helped his BMs a little. He has recently been drinking a lot of sodas and less water. He states he is having to strain to have BMs. Recent, his BMs have been "pebbles". No hematochezia, no melena, no nausea, no vomiting, no fevers, no chills.   Past Medical History:  Diagnosis Date  . Cerebral palsy (HCC)     There are no active problems to display for this patient.   History reviewed. No pertinent surgical history.     Home Medications    Prior to Admission medications   Not on File    Family History No family history on file.  Social History Social History   Tobacco Use  . Smoking status: Current Every Day Smoker    Packs/day: 0.20    Types: Cigarettes  . Smokeless tobacco: Never Used  Substance Use Topics  . Alcohol use: Yes    Comment: occ  . Drug use: No     Allergies   Penicillins   Review of Systems Review of Systems  Constitutional: Negative for chills and fever.  Respiratory: Negative for shortness of breath.   Cardiovascular: Negative for chest pain.  Gastrointestinal: Positive for abdominal pain and constipation. Negative for blood in stool, diarrhea, nausea and vomiting.  Genitourinary: Negative for difficulty urinating, dysuria, frequency and urgency.  All other systems reviewed and are  negative.  Physical Exam Updated Vital Signs BP (!) 123/92 (BP Location: Left Arm)   Pulse 67   Temp 97.9 F (36.6 C)   Resp 17   Ht  (1.702 m)   Wt 104.3 kg (230 lb)   SpO2 99%   BMI 36.02 kg/m   Physical Exam  Constitutional: He is oriented to person, place, and time. He appears well-developed and well-nourished.  HENT:  Head: Normocephalic and atraumatic.  Eyes: Conjunctivae and EOM are normal.  Neck: Normal range of motion. Neck supple.  Cardiovascular: Normal rate and regular rhythm.  Pulmonary/Chest: Effort normal and breath sounds normal. No respiratory distress. He has no wheezes.  Abdominal: Soft. Bowel sounds are normal. He exhibits no distension. There is no tenderness. There is no rebound and no guarding.  Musculoskeletal: Normal range of motion. He exhibits no edema.  Neurological: He is alert and oriented to person, place, and time.  Skin: Skin is warm and dry.  Psychiatric: He has a normal mood and affect. His behavior is normal. Judgment and thought content normal.     ED Treatments / Results  Labs (all labs ordered are listed, but only abnormal results are displayed) Labs Reviewed - No data to display  EKG None  Radiology Dg Abdomen 1 View  Result Date: 05/11/2017 CLINICAL DATA:  27 year old male with a history of constipation EXAM: ABDOMEN - 1 VIEW COMPARISON:  None. FINDINGS: Gas  within stomach, small bowel, colon without abnormal distention. Mild to moderate stool burden with formed stool in the right colon and splenic flexure. No radiopaque foreign body. No unexpected soft tissue density or calcification. No displaced fracture IMPRESSION: Nonobstructive bowel gas pattern with mild to moderate stool burden. Electronically Signed   By: Gilmer Mor D.O.   On: 05/11/2017 13:58    Procedures Procedures (including critical care time)  Medications Ordered in ED Medications - No data to display   Initial Impression / Assessment and Plan / ED  Course  I have reviewed the triage vital signs and the nursing notes.  Pertinent labs & imaging results that were available during my care of the patient were reviewed by me and considered in my medical decision making (see chart for details).  27 year old male with left sided abdominal pain, straining, and decreased BMs over the last couple of months. Consistent with constipation. Abdominal x-ray with nonobstructive bowel gas pattern with mild to moderate stool burden. No nausea, vomiting, or severe abdominal pain to suggest bowel obstruction. No rebound or guarding to suggest more serious intra-abdominal pathology. Will discharge patient with Miralax and Senokot-S. Patient should follow-up with PCP in the next 1-2 weeks.  Final Clinical Impressions(s) / ED Diagnoses   Final diagnoses:  None    ED Discharge Orders    None       Astria Jordahl, Allyn Kenner, MD 05/11/17 1519    Abelino Derrick, MD 05/13/17 [phone]

## 2017-05-11 NOTE — ED Triage Notes (Signed)
Pt presents with c/o constipation for 2 days. Pt reports he has been having problems for a long time

## 2017-05-11 NOTE — Discharge Instructions (Signed)
It was so nice to meet you!  The x-ray of your abdomen showed constipation. I have prescribed a stool softener called Miralax. Please use this once daily as needed. I have also prescribed Senokot-s, which is a combination of a stool softener and a medicine that helps your bowels move stools through them. Please take this once a day as well.

## 2017-07-10 ENCOUNTER — Other Ambulatory Visit: Payer: Self-pay

## 2017-07-10 ENCOUNTER — Encounter (HOSPITAL_BASED_OUTPATIENT_CLINIC_OR_DEPARTMENT_OTHER): Payer: Self-pay

## 2017-07-10 ENCOUNTER — Emergency Department (HOSPITAL_BASED_OUTPATIENT_CLINIC_OR_DEPARTMENT_OTHER)
Admission: EM | Admit: 2017-07-10 | Discharge: 2017-07-10 | Disposition: A | Discharge status: Home / Self Care | Payer: Self-pay | Attending: Emergency Medicine | Admitting: Emergency Medicine

## 2017-07-10 DIAGNOSIS — Z5321 Procedure and treatment not carried out due to patient leaving prior to being seen by health care provider: Secondary | ICD-10-CM | POA: Insufficient documentation

## 2017-07-10 DIAGNOSIS — M79604 Pain in right leg: Secondary | ICD-10-CM | POA: Insufficient documentation

## 2017-07-10 NOTE — ED Triage Notes (Signed)
Pt c/o injury to right LE from a pallet at work 7/4-NAD-steady gait

## 2017-09-24 ENCOUNTER — Other Ambulatory Visit: Payer: Self-pay

## 2017-09-24 ENCOUNTER — Encounter (HOSPITAL_BASED_OUTPATIENT_CLINIC_OR_DEPARTMENT_OTHER): Payer: Self-pay | Admitting: Emergency Medicine

## 2017-09-24 ENCOUNTER — Emergency Department (HOSPITAL_BASED_OUTPATIENT_CLINIC_OR_DEPARTMENT_OTHER)
Admission: EM | Admit: 2017-09-24 | Discharge: 2017-09-24 | Disposition: A | Discharge status: Home / Self Care | Payer: No Typology Code available for payment source | Attending: Emergency Medicine | Admitting: Emergency Medicine

## 2017-09-24 DIAGNOSIS — F17210 Nicotine dependence, cigarettes, uncomplicated: Secondary | ICD-10-CM | POA: Insufficient documentation

## 2017-09-24 DIAGNOSIS — Z79899 Other long term (current) drug therapy: Secondary | ICD-10-CM | POA: Diagnosis not present

## 2017-09-24 DIAGNOSIS — G809 Cerebral palsy, unspecified: Secondary | ICD-10-CM | POA: Diagnosis not present

## 2017-09-24 DIAGNOSIS — K5900 Constipation, unspecified: Secondary | ICD-10-CM | POA: Diagnosis not present

## 2017-09-24 NOTE — ED Triage Notes (Signed)
Reports constipation x 2 months.  States last bowel movement 2 days ago.  States he has tried enemas and stool softeners without relief.

## 2017-09-24 NOTE — ED Notes (Signed)
Pt verbalizes understanding of d/c instructions and denies any further need at this time. 

## 2017-09-24 NOTE — Discharge Instructions (Signed)
It is very important that you are staying well-hydrated with water.  Your urine should be clear to pale yellow. Start eating a high-fiber diet to help regulate your bowel movements. Use 1-2 capfuls of MiraLAX a day to encourage bowel movements. Try to have a regular bowel movement at the same time every day.  If you are unable to have a bowel movement, that is when you should use magnesium citrate. It is very important that you follow-up with a primary care doctor.  There is information about [facility], who you can establish with.  Or you may contact the [phone] number in the back of this paperwork to help you set up primary care. Return to the emergency room if you develop high fevers, persistent vomiting, severe abdominal pain, inability to pass gas.  Or any new or concerning symptoms.

## 2017-09-24 NOTE — ED Provider Notes (Signed)
[facility] EMERGENCY DEPARTMENT Provider Note   CSN: [id number] Arrival date & time: 09/24/17  1441     History   Chief Complaint Chief Complaint  Patient presents with  . Constipation    HPI Ramond CraverRobert Rattigan is a 27 y.o. male presenting for evaluation of constipation.  Patient states he has been having issues with constipation for the past 2 months.  He states he used to go to the bathroom 2-3 times a day, he is now going once every couple days.  He was evaluated for this 2 months ago, but has not followed up with primary care.  He states he has been using MiraLAX at night recently, this is improving his bowel movements.  When he uses mag citrate, he can encourage a bowel movement.  Enemas also help encourage a bowel movement.  He has been using a stool softener every day without significant improvement.  He has not changed his hydration, he is still not drinking very much throughout the day.  He is not eating a high-fiber diet, is eating a lot of dairy and greasy foods.  He denies fevers, chills, nausea, vomiting, abdominal pain.  He denies history of abdominal problems or abdominal surgeries.  He denies urinary symptoms.  His last bowel movement was yesterday.  He is tolerating p.o. without difficulty. He has no other medical problems, takes no medications daily.   HPI  Past Medical History:  Diagnosis Date  . Cerebral palsy (HCC)     There are no active problems to display for this patient.   History reviewed. No pertinent surgical history.      Home Medications    Prior to Admission medications   Medication Sig Start Date End Date Taking? Authorizing Provider  polyethylene glycol powder (GLYCOLAX/MIRALAX) powder Take 17 g by mouth daily. 05/11/17   Mayo, Allyn KennerKaty Dodd, MD  senna-docusate (SENOKOT-S) 8.6-50 MG tablet Take 1 tablet by mouth daily. 05/11/17   Mayo, Allyn KennerKaty Dodd, MD    Family History History reviewed. No pertinent family history.  Social History Social  History   Tobacco Use  . Smoking status: Current Every Day Smoker    Packs/day: 0.20    Types: Cigarettes  . Smokeless tobacco: Never Used  Substance Use Topics  . Alcohol use: Yes    Comment: occ  . Drug use: No     Allergies   Penicillins   Review of Systems Review of Systems  Constitutional: Negative for fever.  Gastrointestinal: Positive for constipation. Negative for abdominal pain, nausea and vomiting.     Physical Exam Updated Vital Signs BP 129/81 (BP Location: Right Arm)   Pulse 67   Temp 98.7 F (37.1 C) (Oral)   Resp 18   Ht 5\' 7"  (1.702 m)   Wt 108.9 kg   SpO2 99%   BMI 37.59 kg/m   Physical Exam  Constitutional: He is oriented to person, place, and time. He appears well-developed and well-nourished. No distress.  HENT:  Head: Normocephalic and atraumatic.  Eyes: EOM are normal.  Neck: Normal range of motion.  Cardiovascular: Normal rate, regular rhythm and intact distal pulses.  Pulmonary/Chest: Effort normal and breath sounds normal. No respiratory distress. He has no wheezes.  Abdominal: Soft. He exhibits no distension and no mass. There is no tenderness. There is no rebound and no guarding.  Musculoskeletal: Normal range of motion.  Neurological: He is alert and oriented to person, place, and time.  Skin: Skin is warm. Capillary refill takes less than  2 seconds. No rash noted.  Psychiatric: He has a normal mood and affect.  Nursing note and vitals reviewed.    ED Treatments / Results  Labs (all labs ordered are listed, but only abnormal results are displayed) Labs Reviewed - No data to display  EKG None  Radiology No results found.  Procedures Procedures (including critical care time)  Medications Ordered in ED Medications - No data to display   Initial Impression / Assessment and Plan / ED Course  I have reviewed the triage vital signs and the nursing notes.  Pertinent labs & imaging results that were available during my  care of the patient were reviewed by me and considered in my medical decision making (see chart for details).     Patient presenting for evaluation of constipation.  Physical exam reassuring, he is afebrile not tachycardic.  Appears nontoxic.  Initially he was tachycardic, this resolved without intervention.  Patient states he is still having bowel movements, they are just not as frequent as he used to have.  He denies nausea or vomiting or abdominal pain.  No fevers or chills.  Doubt intra-abdominal infection, perforation, or surgical abdomen.  Doubt obstruction, as patient is still having bowel movements.  At this time, I do not believe labs or further imaging would be beneficial.  Per chart review, when patient was seen 2 months ago he had a nonobstructing pattern of his bowels.  Do not believe repeat xray would be helpful.  Symptomatic treatments appear to be working, such as MiraLAX and magnesium citrate.  Discussed patient is to continue these treatments, increase his hydration and fiber intake.  Discussed goal of bowel training, having a bowel movement at the same time every day.  Discussed importance of follow-up with primary care for further management of his constipation.  At this time, patient appears safe for discharge.  Return precautions given.  Patient states he understands and agrees to plan.   Final Clinical Impressions(s) / ED Diagnoses   Final diagnoses:  Constipation, unspecified constipation type    ED Discharge Orders    None       Alveria Apley, PA-C 09/24/17 1932    Terrilee Files, MD 09/25/17 1136

## 2018-03-06 DIAGNOSIS — R937 Abnormal findings on diagnostic imaging of other parts of musculoskeletal system: Secondary | ICD-10-CM | POA: Insufficient documentation

## 2018-03-06 DIAGNOSIS — M5126 Other intervertebral disc displacement, lumbar region: Secondary | ICD-10-CM | POA: Insufficient documentation

## 2018-03-07 DIAGNOSIS — R5383 Other fatigue: Secondary | ICD-10-CM | POA: Insufficient documentation

## 2018-03-11 DIAGNOSIS — E559 Vitamin D deficiency, unspecified: Secondary | ICD-10-CM | POA: Insufficient documentation

## 2018-07-26 ENCOUNTER — Emergency Department (HOSPITAL_BASED_OUTPATIENT_CLINIC_OR_DEPARTMENT_OTHER)
Admission: EM | Admit: 2018-07-26 | Discharge: 2018-07-26 | Disposition: A | Discharge status: Home / Self Care | Payer: No Typology Code available for payment source | Attending: Emergency Medicine | Admitting: Emergency Medicine

## 2018-07-26 ENCOUNTER — Other Ambulatory Visit: Payer: Self-pay

## 2018-07-26 ENCOUNTER — Emergency Department (HOSPITAL_BASED_OUTPATIENT_CLINIC_OR_DEPARTMENT_OTHER): Payer: No Typology Code available for payment source

## 2018-07-26 ENCOUNTER — Encounter (HOSPITAL_BASED_OUTPATIENT_CLINIC_OR_DEPARTMENT_OTHER): Payer: Self-pay | Admitting: *Deleted

## 2018-07-26 DIAGNOSIS — R079 Chest pain, unspecified: Secondary | ICD-10-CM

## 2018-07-26 DIAGNOSIS — N3000 Acute cystitis without hematuria: Secondary | ICD-10-CM

## 2018-07-26 DIAGNOSIS — R5383 Other fatigue: Secondary | ICD-10-CM

## 2018-07-26 DIAGNOSIS — G809 Cerebral palsy, unspecified: Secondary | ICD-10-CM | POA: Insufficient documentation

## 2018-07-26 DIAGNOSIS — Z79899 Other long term (current) drug therapy: Secondary | ICD-10-CM | POA: Insufficient documentation

## 2018-07-26 DIAGNOSIS — F17210 Nicotine dependence, cigarettes, uncomplicated: Secondary | ICD-10-CM | POA: Insufficient documentation

## 2018-07-26 LAB — URINALYSIS, ROUTINE W REFLEX MICROSCOPIC
Bilirubin Urine: NEGATIVE
Glucose, UA: NEGATIVE mg/dL
Hgb urine dipstick: NEGATIVE
Ketones, ur: NEGATIVE mg/dL
Nitrite: NEGATIVE
Protein, ur: NEGATIVE mg/dL
Specific Gravity, Urine: 1.025 (ref 1.005–1.030)
pH: 6 (ref 5.0–8.0)

## 2018-07-26 LAB — CBC
HCT: 46.7 % (ref 39.0–52.0)
Hemoglobin: 15.5 g/dL (ref 13.0–17.0)
MCH: 29.3 pg (ref 26.0–34.0)
MCHC: 33.2 g/dL (ref 30.0–36.0)
MCV: 88.3 fL (ref 80.0–100.0)
Platelets: 343 10*3/uL (ref 150–400)
RBC: 5.29 MIL/uL (ref 4.22–5.81)
RDW: 13.2 % (ref 11.5–15.5)
WBC: 11.9 10*3/uL — ABNORMAL HIGH (ref 4.0–10.5)
nRBC: 0 % (ref 0.0–0.2)

## 2018-07-26 LAB — BASIC METABOLIC PANEL
Anion gap: 11 (ref 5–15)
BUN: 12 mg/dL (ref 6–20)
CO2: 23 mmol/L (ref 22–32)
Calcium: 9.1 mg/dL (ref 8.9–10.3)
Chloride: 103 mmol/L (ref 98–111)
Creatinine, Ser: 0.97 mg/dL (ref 0.61–1.24)
GFR calc Af Amer: >60 mL/min (ref >60)
GFR calc non Af Amer: >60 mL/min (ref >60)
Glucose, Bld: 94 mg/dL (ref 70–99)
Potassium: 4.8 mmol/L (ref 3.5–5.1)
Sodium: 137 mmol/L (ref 135–145)

## 2018-07-26 LAB — URINALYSIS, MICROSCOPIC (REFLEX): RBC / HPF: NONE SEEN RBC/hpf (ref 0–5)

## 2018-07-26 LAB — TROPONIN I (HIGH SENSITIVITY): Troponin I (High Sensitivity): 2 ng/L (ref <18)

## 2018-07-26 LAB — CBG MONITORING, ED: Glucose-Capillary: 89 mg/dL (ref 70–99)

## 2018-07-26 MED ORDER — SODIUM CHLORIDE 0.9 % IV BOLUS
1000.0000 mL | Freq: Once | INTRAVENOUS | Status: AC
  Administered 2018-07-26: 1000 mL via INTRAVENOUS

## 2018-07-26 MED ORDER — CEPHALEXIN 500 MG PO CAPS: 500.0000 mg | ORAL_CAPSULE | Freq: Two times a day (BID) | ORAL | 0 refills | Status: AC

## 2018-07-26 NOTE — Discharge Instructions (Signed)
You were seen in the ED today for fatigue and chest pain Your labwork was very reassuring today as well as your chest x ray Your urine looked mildly infectious. We have sent it for culture to see if it grows bacteria. You endorsed having some burning when you pee. Please take antibiotics as prescribed. Please follow up with your PCP.

## 2018-07-26 NOTE — ED Provider Notes (Signed)
[facility] EMERGENCY DEPARTMENT Provider Note   CSN: [id number] Arrival date & time: 07/26/18  1720    History   Chief Complaint Chief Complaint  Patient presents with  . Palpitations    HPI Grant CraverRobert Brown is a 28 y.o. male who presents to the ED today complaining of generalized weakness x "weeks." Pt also endorses intermittent chest pain with left arm pain x months but worsened last night. He was concerned because he had chest pain last night that lasted "Seconds" and then went away. Pt states he could not sleep last night because he was concerned his chest pain would return. He did not take anything for the pain last night. Pt reports he does not think he has slept much lately and that could be causing his fatigue. He also reports he was told in the past that his vitamin D, C, and E levels as well as vitamin B12 were all "very very low" and he thinks that could be causing it too. Denies fever, chills, shortness of breath, cough, abdominal pain, nausea, vomiting, diarrhea, constipation, or any other associated symptoms.        Past Medical History:  Diagnosis Date  . Cerebral palsy (HCC)     There are no active problems to display for this patient.   History reviewed. No pertinent surgical history.      Home Medications    Prior to Admission medications   Medication Sig Start Date End Date Taking? Authorizing Provider  cephALEXin (KEFLEX) 500 MG capsule Take 1 capsule (500 mg total) by mouth 2 (two) times daily for 5 days. 07/26/18 07/31/18  Hyman HopesVenter, [name], PA-C  polyethylene glycol powder (GLYCOLAX/MIRALAX) powder Take 17 g by mouth daily. 05/11/17   Mayo, Allyn KennerKaty Dodd, MD  senna-docusate (SENOKOT-S) 8.6-50 MG tablet Take 1 tablet by mouth daily. 05/11/17   Mayo, Allyn KennerKaty Dodd, MD    Family History No family history on file.  Social History Social History   Tobacco Use  . Smoking status: Current Every Day Smoker    Packs/day: 0.20    Types: Cigarettes  .  Smokeless tobacco: Never Used  Substance Use Topics  . Alcohol use: Yes    Comment: occ  . Drug use: No     Allergies   Penicillins   Review of Systems Review of Systems  Constitutional: Positive for fatigue. Negative for chills and fever.  HENT: Negative for congestion.   Eyes: Negative for visual disturbance.  Respiratory: Negative for cough and shortness of breath.   Cardiovascular: Positive for chest pain. Negative for leg swelling.  Gastrointestinal: Negative for abdominal pain, nausea and vomiting.  Genitourinary: Negative for difficulty urinating.  Musculoskeletal: Negative for myalgias.  Skin: Negative for rash.  Neurological: Positive for weakness (generalized). Negative for headaches.     Physical Exam Updated Vital Signs BP 122/85   Pulse 64   Temp 98.5 F (36.9 C) (Oral)   Resp 12   Ht 5' 7.5" (1.715 m)   Wt 117.9 kg   SpO2 99%   BMI 40.12 kg/m   Physical Exam Vitals signs and nursing note reviewed.  Constitutional:      Appearance: He is not ill-appearing.  HENT:     Head: Normocephalic and atraumatic.  Eyes:     Conjunctiva/sclera: Conjunctivae normal.  Neck:     Musculoskeletal: Neck supple.  Cardiovascular:     Rate and Rhythm: Normal rate and regular rhythm.     Pulses: Normal pulses.  Pulmonary:  Effort: Pulmonary effort is normal.     Breath sounds: Normal breath sounds. No wheezing, rhonchi or rales.  Abdominal:     Palpations: Abdomen is soft.     Tenderness: There is no abdominal tenderness. There is no right CVA tenderness, left CVA tenderness, guarding or rebound.  Musculoskeletal:     Right lower leg: No edema.     Left lower leg: No edema.  Lymphadenopathy:     Cervical: No cervical adenopathy.  Skin:    General: Skin is warm and dry.  Neurological:     Mental Status: He is alert.      ED Treatments / Results  Labs (all labs ordered are listed, but only abnormal results are displayed) Labs Reviewed  CBC -  Abnormal; Notable for the following components:      Result Value   WBC 11.9 (*)    All other components within normal limits  URINALYSIS, ROUTINE W REFLEX MICROSCOPIC - Abnormal; Notable for the following components:   Leukocytes,Ua TRACE (*)    All other components within normal limits  URINALYSIS, MICROSCOPIC (REFLEX) - Abnormal; Notable for the following components:   Bacteria, UA RARE (*)    All other components within normal limits  URINE CULTURE  BASIC METABOLIC PANEL  CBG MONITORING, ED  TROPONIN I (HIGH SENSITIVITY)    EKG None  Radiology Dg Chest Portable 1 View  Result Date: 07/26/2018 CLINICAL DATA:  Chest pain. EXAM: PORTABLE CHEST 1 VIEW COMPARISON:  August 21, 2010 FINDINGS: The heart size and mediastinal contours are within normal limits. Both lungs are clear. The visualized skeletal structures are unremarkable. IMPRESSION: No active disease. Electronically Signed   By: Constance Holster M.D.   On: 07/26/2018 19:16    Procedures Procedures (including critical care time)  Medications Ordered in ED Medications  sodium chloride 0.9 % bolus 1,000 mL (0 mLs Intravenous Stopped 07/26/18 2009)     Initial Impression / Assessment and Plan / ED Course  I have reviewed the triage vital signs and the nursing notes.  Pertinent labs & imaging results that were available during my care of the patient were reviewed by me and considered in my medical decision making (see chart for details).    28 year old otherwise healthy male presenting to the ED with chronic complaints as well as chest pain x last night that lasted several seconds and then went away. Pt very poor historian and unable to answer many questions appropriately. PERC negative. Will workup for ACS although very little suspicion. Will check electrolytes and cell count to ensure no abnormalities as well given vague complaints.   CXR negative. EKG without signs of ischemia. Troponin 2; do not feel pt needs repeat  trop. No electrolyte abnormalities. Pt does have leukocytosis of 11.9; do not have baseline to go off of. Difficult to say if this is pt's normal vs elevated due to pain vs infection. He is afebrile in the ED without tachycardia or tachypnea. Still awaiting U/A; upon further questioning he reports he has had some dysuria here recently. He does not have concerns for STDs today. Pt is sexually active with 1 male partner but they do not use protection.   U/A with trace leuks and rare bacteria. Will send for culture. Given pt has complaints of dysuria as well as mild elevation in WBC count will treat with keflex. Pt has PCN allergy listed but has taken keflex in the past without issue. Advised to follow up with his PCP regarding  symptoms as well as his concern for "low vitamin levels." Pt is in agreement with plan at this time and stable for discharge home.        Final Clinical Impressions(s) / ED Diagnoses   Final diagnoses:  Fatigue, unspecified type  Chest pain, unspecified type  Acute cystitis without hematuria    ED Discharge Orders         Ordered    cephALEXin (KEFLEX) 500 MG capsule  2 times daily     07/26/18 2113           Tanda RockersVenter, Teven Mittman, PA-C 07/26/18 2143    Arby BarrettePfeiffer, Marcy, MD 08/02/18 [phone]

## 2018-07-26 NOTE — ED Notes (Signed)
Pt made aware that we need a urine sample.

## 2018-07-26 NOTE — ED Triage Notes (Signed)
Chest pain on and off for months. States he drinks energy drinks. Pain in his left arm. Last night he felt like he was going to faint while laying in the bed. Sometimes he feels his heart race per pt.

## 2018-07-26 NOTE — ED Notes (Signed)
Pt provided some juice and crackers per EDP. Pt is aware that we need a urine sample

## 2018-07-26 NOTE — ED Notes (Signed)
Pt. Grant Brown he was needing his vitamin D and Vit C and Vit E and all vitamin levels checked.  RN explained he would probably need to see his PMD for all of that to be check again that the ED does the emergent care and making sure he is not having a heart attack.

## 2018-07-28 LAB — URINE CULTURE: Culture: <10000 — AB

## 2018-08-18 ENCOUNTER — Other Ambulatory Visit: Payer: Self-pay

## 2018-08-18 ENCOUNTER — Encounter (HOSPITAL_BASED_OUTPATIENT_CLINIC_OR_DEPARTMENT_OTHER): Payer: Self-pay | Admitting: Emergency Medicine

## 2018-08-18 ENCOUNTER — Emergency Department (HOSPITAL_BASED_OUTPATIENT_CLINIC_OR_DEPARTMENT_OTHER)
Admission: EM | Admit: 2018-08-18 | Discharge: 2018-08-18 | Discharge status: Against Medical Advice | Payer: No Typology Code available for payment source | Attending: Emergency Medicine | Admitting: Emergency Medicine

## 2018-08-18 DIAGNOSIS — R42 Dizziness and giddiness: Secondary | ICD-10-CM | POA: Diagnosis not present

## 2018-08-18 DIAGNOSIS — R531 Weakness: Secondary | ICD-10-CM | POA: Insufficient documentation

## 2018-08-18 DIAGNOSIS — Z5321 Procedure and treatment not carried out due to patient leaving prior to being seen by health care provider: Secondary | ICD-10-CM | POA: Insufficient documentation

## 2018-08-18 DIAGNOSIS — I10 Essential (primary) hypertension: Secondary | ICD-10-CM | POA: Diagnosis present

## 2018-08-18 NOTE — ED Notes (Signed)
Called for room no answer

## 2018-08-18 NOTE — ED Notes (Signed)
Pt called X1. No one answered and no one visualized in the lobby.

## 2018-08-18 NOTE — ED Triage Notes (Signed)
Reports his mom took his BP today and was 150/120. Denies headaches and blurred vision,  States feeling weak and some dizziness. Denies medical hx. Reports increased stress "a situation with a male."

## 2018-08-18 NOTE — ED Triage Notes (Addendum)
Pt called x 2 for room no answer

## 2019-02-09 ENCOUNTER — Encounter (HOSPITAL_BASED_OUTPATIENT_CLINIC_OR_DEPARTMENT_OTHER): Payer: Self-pay | Admitting: Emergency Medicine

## 2019-02-09 ENCOUNTER — Other Ambulatory Visit: Payer: Self-pay

## 2019-02-09 ENCOUNTER — Emergency Department (HOSPITAL_BASED_OUTPATIENT_CLINIC_OR_DEPARTMENT_OTHER)
Admission: EM | Admit: 2019-02-09 | Discharge: 2019-02-09 | Disposition: A | Discharge status: Home / Self Care | Payer: Self-pay | Attending: Emergency Medicine | Admitting: Emergency Medicine

## 2019-02-09 DIAGNOSIS — U071 COVID-19: Secondary | ICD-10-CM | POA: Insufficient documentation

## 2019-02-09 DIAGNOSIS — G809 Cerebral palsy, unspecified: Secondary | ICD-10-CM | POA: Insufficient documentation

## 2019-02-09 DIAGNOSIS — Z20822 Contact with and (suspected) exposure to covid-19: Secondary | ICD-10-CM

## 2019-02-09 DIAGNOSIS — R430 Anosmia: Secondary | ICD-10-CM | POA: Insufficient documentation

## 2019-02-09 DIAGNOSIS — Z79899 Other long term (current) drug therapy: Secondary | ICD-10-CM | POA: Insufficient documentation

## 2019-02-09 DIAGNOSIS — K5900 Constipation, unspecified: Secondary | ICD-10-CM | POA: Insufficient documentation

## 2019-02-09 DIAGNOSIS — R0602 Shortness of breath: Secondary | ICD-10-CM | POA: Insufficient documentation

## 2019-02-09 DIAGNOSIS — F17210 Nicotine dependence, cigarettes, uncomplicated: Secondary | ICD-10-CM | POA: Insufficient documentation

## 2019-02-09 MED ORDER — LACTULOSE 10 G PO PACK: 10.0000 g | PACK | Freq: Three times a day (TID) | ORAL | 0 refills | Status: AC

## 2019-02-09 NOTE — ED Triage Notes (Signed)
Constipation x 5 days. Loss of taste and smell.

## 2019-02-09 NOTE — Discharge Instructions (Signed)
Stay quarantined until you get your Covid results.  If your Covid result is positive, you need to quarantine for the next 10 days following the Covid test.  Follow-up with your doctor regarding your constipation issues.  Return here as needed if you have any worsening symptoms.

## 2019-02-09 NOTE — ED Provider Notes (Signed)
[facility] EMERGENCY DEPARTMENT Provider Note   CSN: [id number] Arrival date & time: 02/09/19  1744     History Chief Complaint  Patient presents with  . Constipation  . COVID symptoms    Grant Brown is a 29 y.o. male.  Patient is a 29 year old male who presents with 2 things.  1 he feels like he needs a Covid test.  He has had a 3-day history of some minor URI symptoms and loss of taste and smell.  He denies any significant cough or chest congestion.  Occasionally he has some shortness of breath on exertion.  No vomiting or diarrhea.  He also reports that he is requesting summing for constipation.  He has had constipation for the last 2 to 3 years.  He says he has tried MiraLAX in the past with no improvement.  Initially told me that was the only thing that he is tried although he then divulged that he had tried some other things with no improvement.  He has had some chronic discomfort in his left upper abdomen for the last 2 years as well that he reports is constant and ongoing.  Is unchanged.  He has no nausea or vomiting.  No fevers.  No urinary symptoms.  He has been seen for this by his PCP but has not been referred to a gastroenterologist.        Past Medical History:  Diagnosis Date  . Cerebral palsy (HCC)     There are no problems to display for this patient.   History reviewed. No pertinent surgical history.     No family history on file.  Social History   Tobacco Use  . Smoking status: Current Every Day Smoker    Packs/day: 0.20    Types: Cigarettes  . Smokeless tobacco: Never Used  Substance Use Topics  . Alcohol use: Yes    Comment: occ  . Drug use: No    Home Medications Prior to Admission medications   Medication Sig Start Date End Date Taking? Authorizing Provider  lactulose (CEPHULAC) 10 g packet Take 1 packet (10 g total) by mouth 3 (three) times daily. 02/09/19   Rolan Bucco, MD  polyethylene glycol powder (GLYCOLAX/MIRALAX)  powder Take 17 g by mouth daily. 05/11/17   Mayo, Allyn Kenner, MD  senna-docusate (SENOKOT-S) 8.6-50 MG tablet Take 1 tablet by mouth daily. 05/11/17   Mayo, Allyn Kenner, MD    Allergies    Penicillins  Review of Systems   Review of Systems  Constitutional: Negative for chills, diaphoresis, fatigue and fever.  HENT: Positive for congestion and rhinorrhea. Negative for sneezing.   Eyes: Negative.   Respiratory: Positive for shortness of breath (minor). Negative for cough and chest tightness.   Cardiovascular: Negative for chest pain and leg swelling.  Gastrointestinal: Positive for abdominal pain and constipation. Negative for blood in stool, diarrhea, nausea and vomiting.  Genitourinary: Negative for difficulty urinating, flank pain, frequency and hematuria.  Musculoskeletal: Negative for arthralgias and back pain.  Skin: Negative for rash.  Neurological: Negative for dizziness, speech difficulty, weakness, numbness and headaches.    Physical Exam Updated Vital Signs BP 126/90 (BP Location: Right Arm)   Pulse 86   Temp 97.9 F (36.6 C) (Oral)   Resp 18   Ht 5\' 7"  (1.702 m)   Wt 117.9 kg   SpO2 99%   BMI 40.72 kg/m   Physical Exam Constitutional:      Appearance: He is well-developed.  HENT:  Head: Normocephalic and atraumatic.  Eyes:     Pupils: Pupils are equal, round, and reactive to light.  Cardiovascular:     Rate and Rhythm: Normal rate and regular rhythm.     Heart sounds: Normal heart sounds.  Pulmonary:     Effort: Pulmonary effort is normal. No respiratory distress.     Breath sounds: Normal breath sounds. No wheezing or rales.  Chest:     Chest wall: No tenderness.  Abdominal:     General: Bowel sounds are normal.     Palpations: Abdomen is soft.     Tenderness: There is no abdominal tenderness. There is no guarding or rebound.  Musculoskeletal:        General: Normal range of motion.     Cervical back: Normal range of motion and neck supple.    Lymphadenopathy:     Cervical: No cervical adenopathy.  Skin:    General: Skin is warm and dry.     Findings: No rash.  Neurological:     Mental Status: He is alert and oriented to person, place, and time.     ED Results / Procedures / Treatments   Labs (all labs ordered are listed, but only abnormal results are displayed) Labs Reviewed  SARS CORONAVIRUS 2 (TAT 6-24 HRS)    EKG None  Radiology No results found.  Procedures Procedures (including critical care time)  Medications Ordered in ED Medications - No data to display  ED Course  I have reviewed the triage vital signs and the nursing notes.  Pertinent labs & imaging results that were available during my care of the patient were reviewed by me and considered in my medical decision making (see chart for details).    MDM Rules/Calculators/A&P                     Patient was given Covid testing.  He was given Covid precautions.  He has no hypoxia or increased work of breathing.  His lungs are clear on exam.  He was offered a prescription for lactulose for his constipation.  He has no abdominal tenderness on my exam.  I do not feel that he needs any further imaging or labs today as this is been an ongoing problem for the last 2 to 3 years.  I did discuss that he may need referral to GI and he can discuss this with his PCP.  Return precautions were given. Final Clinical Impression(s) / ED Diagnoses Final diagnoses:  Constipation, unspecified constipation type  Encounter for laboratory testing for COVID-19 virus    Rx / DC Orders ED Discharge Orders         Ordered    lactulose (CEPHULAC) 10 g packet  3 times daily     02/09/19 1824           Malvin Johns, MD 02/09/19 2426

## 2019-02-10 LAB — SARS CORONAVIRUS 2 (TAT 6-24 HRS): SARS Coronavirus 2: POSITIVE — AB

## 2019-07-03 ENCOUNTER — Encounter (HOSPITAL_BASED_OUTPATIENT_CLINIC_OR_DEPARTMENT_OTHER): Payer: Self-pay

## 2019-07-03 ENCOUNTER — Emergency Department (HOSPITAL_BASED_OUTPATIENT_CLINIC_OR_DEPARTMENT_OTHER)
Admission: EM | Admit: 2019-07-03 | Discharge: 2019-07-04 | Disposition: A | Discharge status: Home / Self Care | Payer: Self-pay | Attending: Emergency Medicine | Admitting: Emergency Medicine

## 2019-07-03 ENCOUNTER — Other Ambulatory Visit: Payer: Self-pay

## 2019-07-03 DIAGNOSIS — M79602 Pain in left arm: Secondary | ICD-10-CM

## 2019-07-03 DIAGNOSIS — F17210 Nicotine dependence, cigarettes, uncomplicated: Secondary | ICD-10-CM | POA: Insufficient documentation

## 2019-07-03 DIAGNOSIS — K5909 Other constipation: Secondary | ICD-10-CM

## 2019-07-03 LAB — URINALYSIS, ROUTINE W REFLEX MICROSCOPIC
Bilirubin Urine: NEGATIVE
Glucose, UA: NEGATIVE mg/dL
Hgb urine dipstick: NEGATIVE
Ketones, ur: NEGATIVE mg/dL
Leukocytes,Ua: NEGATIVE
Nitrite: NEGATIVE
Protein, ur: NEGATIVE mg/dL
Specific Gravity, Urine: >1.03 — ABNORMAL HIGH (ref 1.005–1.030)
pH: 6 (ref 5.0–8.0)

## 2019-07-03 LAB — BASIC METABOLIC PANEL
Anion gap: 11 (ref 5–15)
BUN: 15 mg/dL (ref 6–20)
CO2: 22 mmol/L (ref 22–32)
Calcium: 9.2 mg/dL (ref 8.9–10.3)
Chloride: 105 mmol/L (ref 98–111)
Creatinine, Ser: 0.95 mg/dL (ref 0.61–1.24)
GFR calc Af Amer: >60 mL/min (ref >60)
GFR calc non Af Amer: >60 mL/min (ref >60)
Glucose, Bld: 139 mg/dL — ABNORMAL HIGH (ref 70–99)
Potassium: 3.9 mmol/L (ref 3.5–5.1)
Sodium: 138 mmol/L (ref 135–145)

## 2019-07-03 LAB — CBC
HCT: 45.3 % (ref 39.0–52.0)
Hemoglobin: 15.5 g/dL (ref 13.0–17.0)
MCH: 29.7 pg (ref 26.0–34.0)
MCHC: 34.2 g/dL (ref 30.0–36.0)
MCV: 86.8 fL (ref 80.0–100.0)
Platelets: 355 10*3/uL (ref 150–400)
RBC: 5.22 MIL/uL (ref 4.22–5.81)
RDW: 12.9 % (ref 11.5–15.5)
WBC: 12.5 10*3/uL — ABNORMAL HIGH (ref 4.0–10.5)
nRBC: 0 % (ref 0.0–0.2)

## 2019-07-03 LAB — TROPONIN I (HIGH SENSITIVITY): Troponin I (High Sensitivity): 3 ng/L (ref <18)

## 2019-07-03 MED ORDER — NAPROXEN 250 MG PO TABS
500.0000 mg | ORAL_TABLET | Freq: Once | ORAL | Status: AC
  Administered 2019-07-04: 500 mg via ORAL
  Filled 2019-07-03: qty 2

## 2019-07-03 MED ORDER — SODIUM CHLORIDE 0.9% FLUSH
3.0000 mL | Freq: Once | INTRAVENOUS | Status: DC
  Filled 2019-07-03: qty 3

## 2019-07-03 MED ORDER — KETOROLAC TROMETHAMINE 15 MG/ML IJ SOLN
15.0000 mg | Freq: Once | INTRAMUSCULAR | Status: DC
  Filled 2019-07-03: qty 1

## 2019-07-03 MED ORDER — SODIUM CHLORIDE 0.9 % IV BOLUS: 1000.0000 mL | Freq: Once | INTRAVENOUS | Status: DC

## 2019-07-03 NOTE — ED Triage Notes (Signed)
Pt c/o "feeling like I'm gonna black out" x 8 weeks-pain to left UE x 6 weeks-denies injury-NAD-steady gait

## 2019-07-03 NOTE — ED Provider Notes (Signed)
[facility] Provider Note: [name], MD, FACEP  CSN: [id number] MRN: [id number] ARRIVAL: 07/03/19 at 2025 ROOM: MH04/MH04   CHIEF COMPLAINT  Arm Pain   HISTORY OF PRESENT ILLNESS  07/03/19 11:21 PM Grant Brown is a 29 y.o. male who complains of pain in his left arm for the past 6 weeks.  He states the pain came "out of nowhere".  He denies any injury.  He states the pain hurts from his shoulder down to his elbow.  The pain is primarily located at the distal aspect of the triceps muscle.  He describes the pain as a "radiant" pain, 5 out of 10 in severity.  It is not worse with movement.  He also complains of over a year of constipation which has not been relieved by the lactulose, Senokot S and MiraLAX he has been taking.  He is a Naval architect by profession.  He has not taken anything for his arm pain "because I do not have anything to take".  Past Medical History:  Diagnosis Date  . Cerebral palsy (HCC)     History reviewed. No pertinent surgical history.  No family history on file.  Social History   Tobacco Use  . Smoking status: Current Every Day Smoker    Packs/day: 0.20    Types: Cigarettes  . Smokeless tobacco: Never Used  Vaping Use  . Vaping Use: Never used  Substance Use Topics  . Alcohol use: Yes    Comment: occ  . Drug use: No    Prior to Admission medications   Medication Sig Start Date End Date Taking? Authorizing Provider  lactulose (CEPHULAC) 10 g packet Take 1 packet (10 g total) by mouth 3 (three) times daily. 02/09/19   Rolan Bucco, MD  naproxen (NAPROSYN) 375 MG tablet Take 1 tablet twice daily as needed for arm pain. 07/04/19   Angele Wiemann, MD  polyethylene glycol powder (GLYCOLAX/MIRALAX) powder Take 17 g by mouth daily. 05/11/17   Mayo, Allyn Kenner, MD  senna-docusate (SENOKOT-S) 8.6-50 MG tablet Take 1 tablet by mouth daily. 05/11/17   Mayo, Allyn Kenner, MD    Allergies Penicillins   REVIEW OF SYSTEMS  Negative except as noted  here or in the History of Present Illness.   PHYSICAL EXAMINATION  Initial Vital Signs Blood pressure 140/84, pulse 80, temperature 98.4 F (36.9 C), temperature source Oral, resp. rate 16, SpO2 98 %.  Examination General: Well-developed, well-nourished male in no acute distress; appearance consistent with age of record HENT: normocephalic; atraumatic Eyes: pupils equal, round and reactive to light; extraocular muscles intact Neck: supple Heart: regular rate and rhythm Lungs: clear to auscultation bilaterally Abdomen: soft; nondistended; nontender; bowel sounds present Extremities: No deformity; full range of motion; pulses normal; mild tenderness of left distal triceps but no bony tenderness of the left elbow Neurologic: Awake, alert and oriented; motor function intact in all extremities and symmetric; no facial droop Skin: Warm and dry Psychiatric: Normal mood and affect   RESULTS  Summary of this visit's results, reviewed and interpreted by myself:   EKG Interpretation  Date/Time:  Thursday July 03 2019 20:32:53 EDT Ventricular Rate:  82 PR Interval:  138 QRS Duration: 80 QT Interval:  346 QTC Calculation: 404 R Axis:   14 Text Interpretation: Normal sinus rhythm with sinus arrhythmia Normal ECG No significant change was found Confirmed by Haddie Bruhl ([id number]) on 07/03/2019 10:31:05 PM      Laboratory Studies: Results for orders placed or performed during the  hospital encounter of 07/03/19 (from the past 24 hour(s))  Troponin I (High Sensitivity)     Status: None   Collection Time: 07/03/19  8:42 PM  Result Value Ref Range   Troponin I (High Sensitivity) 3 <18 ng/L  Basic metabolic panel     Status: Abnormal   Collection Time: 07/03/19  8:46 PM  Result Value Ref Range   Sodium 138 135 - 145 mmol/L   Potassium 3.9 3.5 - 5.1 mmol/L   Chloride 105 98 - 111 mmol/L   CO2 22 22 - 32 mmol/L   Glucose, Bld 139 (H) 70 - 99 mg/dL   BUN 15 6 - 20 mg/dL   Creatinine, Ser  6.43 0.61 - 1.24 mg/dL   Calcium 9.2 8.9 - 32.9 mg/dL   GFR calc non Af Amer >60 >60 mL/min   GFR calc Af Amer >60 >60 mL/min   Anion gap 11 5 - 15  CBC     Status: Abnormal   Collection Time: 07/03/19  8:46 PM  Result Value Ref Range   WBC 12.5 (H) 4.0 - 10.5 K/uL   RBC 5.22 4.22 - 5.81 MIL/uL   Hemoglobin 15.5 13.0 - 17.0 g/dL   HCT 51.8 39 - 52 %   MCV 86.8 80.0 - 100.0 fL   MCH 29.7 26.0 - 34.0 pg   MCHC 34.2 30.0 - 36.0 g/dL   RDW 84.1 66.0 - 63.0 %   Platelets 355 150 - 400 K/uL   nRBC 0.0 0.0 - 0.2 %  Urinalysis, Routine w reflex microscopic     Status: Abnormal   Collection Time: 07/03/19  9:04 PM  Result Value Ref Range   Color, Urine YELLOW YELLOW   APPearance CLEAR CLEAR   Specific Gravity, Urine >1.030 (H) 1.005 - 1.030   pH 6.0 5.0 - 8.0   Glucose, UA NEGATIVE NEGATIVE mg/dL   Hgb urine dipstick NEGATIVE NEGATIVE   Bilirubin Urine NEGATIVE NEGATIVE   Ketones, ur NEGATIVE NEGATIVE mg/dL   Protein, ur NEGATIVE NEGATIVE mg/dL   Nitrite NEGATIVE NEGATIVE   Leukocytes,Ua NEGATIVE NEGATIVE   Imaging Studies: No results found.  ED COURSE and MDM  Nursing notes, initial and subsequent vitals signs, including pulse oximetry, reviewed and interpreted by myself.  Vitals:   07/03/19 2209 07/03/19 2309  BP: (!) 141/86 140/84  Pulse: 80 80  Resp: 16 16  Temp:  98.4 F (36.9 C)  TempSrc:  Oral  SpO2: 97% 98%   Medications  naproxen (NAPROSYN) tablet 500 mg (has no administration in time range)   11:57 PM Although urinalysis suggests dehydration the patient declined IV fluids or IV Toradol.  His arm pain may be due to some triceps tendinitis.  We will start him on an NSAID and refer to sports medicine.  His chronic constipation is best addressed by his PCP.   PROCEDURES  Procedures   ED DIAGNOSES     ICD-10-CM   1. Left arm pain  M79.602   2. Chronic constipation  K59.09        Blonnie Maske, MD 07/04/19 0001

## 2019-07-03 NOTE — ED Notes (Signed)
Left upper arm pain x 4 weeks. Shooting pain from shoulder to elbow.

## 2019-07-04 MED ORDER — NAPROXEN 375 MG PO TABS
ORAL_TABLET | ORAL | 0 refills | Status: DC
Start: 2019-07-04 — End: 2019-09-14

## 2019-09-08 ENCOUNTER — Emergency Department (HOSPITAL_BASED_OUTPATIENT_CLINIC_OR_DEPARTMENT_OTHER)
Admission: EM | Admit: 2019-09-08 | Discharge: 2019-09-09 | Disposition: A | Discharge status: Home / Self Care | Payer: Medicaid Other | Attending: Emergency Medicine | Admitting: Emergency Medicine

## 2019-09-08 ENCOUNTER — Other Ambulatory Visit: Payer: Self-pay

## 2019-09-08 ENCOUNTER — Encounter (HOSPITAL_BASED_OUTPATIENT_CLINIC_OR_DEPARTMENT_OTHER): Payer: Self-pay

## 2019-09-08 DIAGNOSIS — F419 Anxiety disorder, unspecified: Secondary | ICD-10-CM | POA: Insufficient documentation

## 2019-09-08 DIAGNOSIS — F17210 Nicotine dependence, cigarettes, uncomplicated: Secondary | ICD-10-CM | POA: Insufficient documentation

## 2019-09-08 NOTE — ED Triage Notes (Signed)
Pt c/o "panic attack" x 1 hour-NAD-steady gait

## 2019-09-09 MED ORDER — HYDROXYZINE HCL 25 MG PO TABS
25.0000 mg | ORAL_TABLET | Freq: Four times a day (QID) | ORAL | 0 refills | Status: DC | PRN
Start: 2019-09-09 — End: 2019-09-18

## 2019-09-09 NOTE — ED Provider Notes (Signed)
Emergency Department Provider Note   I have reviewed the triage vital signs and the nursing notes.   HISTORY  Chief Complaint Panic Attack   HPI Grant Brown is a 29 y.o. male with PMH reviewed presents to the ED with daily anxiety symptoms.  Patient tells me that he has daily waves of anxiety which are causing him increasing distress.  He denies thoughts of harming himself or others.  He had an episode this evening which ultimately prompted ED evaluation.  He states he was seen at an emergency department in [place] and prescribed a medication which she has in his car but is unsure the name.  He has been on BuSpar in the past but is no longer taking this medicine.  He is feeling improved now in the ED. he denies any chest pain or shortness of breath.  No heart palpitations actively.  No radiation of symptoms or other modifying factors. Denies any EtOH or drugs.    Past Medical History:  Diagnosis Date  . Cerebral palsy (HCC)     There are no problems to display for this patient.   History reviewed. No pertinent surgical history.  Allergies Penicillins  No family history on file.  Social History Social History   Tobacco Use  . Smoking status: Current Every Day Smoker    Packs/day: 0.20    Types: Cigarettes  . Smokeless tobacco: Never Used  Vaping Use  . Vaping Use: Never used  Substance Use Topics  . Alcohol use: Yes    Comment: occ  . Drug use: No    Review of Systems  Constitutional: No fever/chills Eyes: No visual changes. ENT: No sore throat. Cardiovascular: Denies chest pain. Respiratory: Denies shortness of breath. Gastrointestinal: No abdominal pain.  No nausea, no vomiting.  No diarrhea.  No constipation. Genitourinary: Negative for dysuria. Musculoskeletal: Negative for back pain. Skin: Negative for rash. Neurological: Negative for headaches, focal weakness or numbness. Psychiatric: Denies SI/HI. Positive anxiety symptoms.   10-point ROS  otherwise negative.  ____________________________________________   PHYSICAL EXAM:  VITAL SIGNS: ED Triage Vitals  Enc Vitals Group     BP 09/08/19 2247 131/78     Pulse Rate 09/08/19 2247 98     Resp 09/08/19 2247 18     Temp 09/08/19 2247 97.9 F (36.6 C)     Temp Source 09/08/19 2247 Oral     SpO2 09/08/19 2247 98 %     Weight 09/08/19 2247 271 lb (122.9 kg)     Height 09/08/19 2247 5\' 8"  (1.727 m)   Constitutional: Alert and oriented. Well appearing and in no acute distress. Eyes: Conjunctivae are normal. Head: Atraumatic. Nose: No congestion/rhinnorhea. Mouth/Throat: Mucous membranes are moist. Neck: No stridor.  Cardiovascular: Good peripheral circulation. Respiratory: Normal respiratory effort Gastrointestinal: No distention.  Musculoskeletal:  No gross deformities of extremities. Neurologic:  Normal speech and language. Skin:  Skin is warm, dry and intact. No rash noted. Psychiatric: Mood and affect are normal. Speech and behavior are normal.  ____________________________________________   PROCEDURES  Procedure(s) performed:   Procedures  None  ____________________________________________   INITIAL IMPRESSION / ASSESSMENT AND PLAN / ED COURSE  Pertinent labs & imaging results that were available during my care of the patient were reviewed by me and considered in my medical decision making (see chart for details).   Patient arrives emergency department with intermittent anxiety symptoms but no active symptoms.  Vital signs are within normal limits.  Patient is not currently under the  care of a psychiatrist or PCP.  His exam is unremarkable.  He denies any SI/HI.  I do not feel he is currently a danger to himself or others.  He does not require emergent psychiatry evaluation.  Will prescribe Atarax to take as needed for anxiety symptoms.  Discussed strict ED return precautions and provided contact information for PCP at time of discharge with plan for patient  to call in the AM for an ASAP appointment.    ____________________________________________  FINAL CLINICAL IMPRESSION(S) / ED DIAGNOSES  Final diagnoses:  Anxiety    NEW OUTPATIENT MEDICATIONS STARTED DURING THIS VISIT:  Discharge Medication List as of 09/09/2019 12:39 AM    START taking these medications   Details  hydrOXYzine (ATARAX/VISTARIL) 25 MG tablet Take 1 tablet (25 mg total) by mouth every 6 (six) hours as needed for anxiety., Starting Tue 09/09/2019, Print        Note:  This document was prepared using Dragon voice recognition software and may include unintentional dictation errors.  Alona Bene, MD, [facility]    Amma Crear, Arlyss Repress, MD 09/09/19 [phone]

## 2019-09-09 NOTE — Discharge Instructions (Signed)
You were seen in the emergency room today with anxiety symptoms.  I'm starting you on a medication called Atarax to take as needed for anxiety symptoms.  Please establish care with a primary care doctor.  They can help to manage her anxiety symptoms or refer you to a mental health provider as needed.  Return to the emergency department any new or suddenly worsening symptoms including thoughts of harming herself or harming others.

## 2019-09-10 DIAGNOSIS — R200 Anesthesia of skin: Secondary | ICD-10-CM | POA: Diagnosis not present

## 2019-09-10 DIAGNOSIS — F17200 Nicotine dependence, unspecified, uncomplicated: Secondary | ICD-10-CM | POA: Diagnosis not present

## 2019-09-10 DIAGNOSIS — F419 Anxiety disorder, unspecified: Secondary | ICD-10-CM | POA: Diagnosis not present

## 2019-09-10 DIAGNOSIS — R072 Precordial pain: Secondary | ICD-10-CM | POA: Diagnosis not present

## 2019-09-10 DIAGNOSIS — M79602 Pain in left arm: Secondary | ICD-10-CM | POA: Diagnosis not present

## 2019-09-10 DIAGNOSIS — R002 Palpitations: Secondary | ICD-10-CM | POA: Diagnosis not present

## 2019-09-10 DIAGNOSIS — R0602 Shortness of breath: Secondary | ICD-10-CM | POA: Diagnosis not present

## 2019-09-10 DIAGNOSIS — F481 Depersonalization-derealization syndrome: Secondary | ICD-10-CM | POA: Diagnosis not present

## 2019-09-11 DIAGNOSIS — I498 Other specified cardiac arrhythmias: Secondary | ICD-10-CM | POA: Diagnosis not present

## 2019-09-13 ENCOUNTER — Other Ambulatory Visit: Payer: Self-pay

## 2019-09-13 ENCOUNTER — Emergency Department (HOSPITAL_BASED_OUTPATIENT_CLINIC_OR_DEPARTMENT_OTHER)
Admission: EM | Admit: 2019-09-13 | Discharge: 2019-09-13 | Disposition: A | Discharge status: Home / Self Care | Payer: Medicaid Other | Attending: Emergency Medicine | Admitting: Emergency Medicine

## 2019-09-13 ENCOUNTER — Encounter (HOSPITAL_BASED_OUTPATIENT_CLINIC_OR_DEPARTMENT_OTHER): Payer: Self-pay | Admitting: Emergency Medicine

## 2019-09-13 DIAGNOSIS — W28XXXA Contact with powered lawn mower, initial encounter: Secondary | ICD-10-CM | POA: Insufficient documentation

## 2019-09-13 DIAGNOSIS — Y939 Activity, unspecified: Secondary | ICD-10-CM | POA: Insufficient documentation

## 2019-09-13 DIAGNOSIS — Y9289 Other specified places as the place of occurrence of the external cause: Secondary | ICD-10-CM | POA: Diagnosis not present

## 2019-09-13 DIAGNOSIS — Y999 Unspecified external cause status: Secondary | ICD-10-CM | POA: Insufficient documentation

## 2019-09-13 DIAGNOSIS — S0990XA Unspecified injury of head, initial encounter: Secondary | ICD-10-CM | POA: Insufficient documentation

## 2019-09-13 DIAGNOSIS — Z5321 Procedure and treatment not carried out due to patient leaving prior to being seen by health care provider: Secondary | ICD-10-CM | POA: Insufficient documentation

## 2019-09-13 NOTE — ED Triage Notes (Signed)
Pt reports hitting his head on a shoe rack one week ago, states he has been feeling like passing out. States struck in the front of his head. States ibuprofen not effective.

## 2019-09-14 ENCOUNTER — Emergency Department (HOSPITAL_BASED_OUTPATIENT_CLINIC_OR_DEPARTMENT_OTHER)
Admission: EM | Admit: 2019-09-14 | Discharge: 2019-09-14 | Disposition: A | Discharge status: Home / Self Care | Payer: BC Managed Care – PPO | Attending: Emergency Medicine | Admitting: Emergency Medicine

## 2019-09-14 ENCOUNTER — Emergency Department (HOSPITAL_BASED_OUTPATIENT_CLINIC_OR_DEPARTMENT_OTHER): Payer: BC Managed Care – PPO

## 2019-09-14 ENCOUNTER — Encounter (HOSPITAL_BASED_OUTPATIENT_CLINIC_OR_DEPARTMENT_OTHER): Payer: Self-pay | Admitting: Emergency Medicine

## 2019-09-14 DIAGNOSIS — X58XXXA Exposure to other specified factors, initial encounter: Secondary | ICD-10-CM | POA: Insufficient documentation

## 2019-09-14 DIAGNOSIS — F17210 Nicotine dependence, cigarettes, uncomplicated: Secondary | ICD-10-CM | POA: Diagnosis not present

## 2019-09-14 DIAGNOSIS — S0990XA Unspecified injury of head, initial encounter: Secondary | ICD-10-CM | POA: Diagnosis not present

## 2019-09-14 DIAGNOSIS — Y9289 Other specified places as the place of occurrence of the external cause: Secondary | ICD-10-CM | POA: Diagnosis not present

## 2019-09-14 DIAGNOSIS — Y999 Unspecified external cause status: Secondary | ICD-10-CM | POA: Insufficient documentation

## 2019-09-14 DIAGNOSIS — Y939 Activity, unspecified: Secondary | ICD-10-CM | POA: Diagnosis not present

## 2019-09-14 MED ORDER — KETOROLAC TROMETHAMINE 15 MG/ML IJ SOLN
15.0000 mg | Freq: Once | INTRAMUSCULAR | Status: AC
  Administered 2019-09-14: 15 mg via INTRAMUSCULAR
  Filled 2019-09-14: qty 1

## 2019-09-14 MED ORDER — ACETAMINOPHEN 325 MG PO TABS
650.0000 mg | ORAL_TABLET | Freq: Once | ORAL | Status: AC
  Administered 2019-09-14: 650 mg via ORAL
  Filled 2019-09-14: qty 2

## 2019-09-14 MED ORDER — NAPROXEN 375 MG PO TABS
375.0000 mg | ORAL_TABLET | Freq: Two times a day (BID) | ORAL | 0 refills | Status: AC
Start: 2019-09-14 — End: 2019-09-21

## 2019-09-14 NOTE — ED Triage Notes (Signed)
He hit his head on a shoe rack 2 weeks ago. Reports he has been having memory issues and panic attacks since then.

## 2019-09-14 NOTE — ED Provider Notes (Signed)
[facility] EMERGENCY DEPARTMENT Provider Note   CSN: [id number] Arrival date & time: 09/14/19  1003     History Chief Complaint  Patient presents with  . Head Injury    Grant Brown is a 29 y.o. male with past medical history significant for cerebral palsy. Not anticoagulated.  HPI Patient presents to emergency department today with chief complaint of head injury x2 weeks ago.  He states he was bending down to get a pair of shoes at the closet for his son and forgot there was a metal shelf there.  He was hit in the forehead with a metal shelf.  He has had a persistent headache ever since the injury.  He is also endorsing photophobia, memory issues and just does not feel like himself. He has tried taking ibuprofen and tylenol without symptom improvement. 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 He is also experiencing increased amount of panic attacks. He has history of anxiety that is typically well controlled with Wellbutrin and Buspar.  He has been see in an outside ED twice for anxiety. Chart review shows he went to an urgent care x 2 days ago. An outpatient head CT was ordered, has not yet been performed. He denies any fever, syncope,  neck stiffness, neck pain, nausea, vomiting, thunderclap headache,  facial drooping, slurred speech, unilateral weakness, seizures.    Past Medical History:  Diagnosis Date  . Cerebral palsy (HCC)     There are no problems to display for this patient.   History reviewed. No pertinent surgical history.     No family history on file.  Social History   Tobacco Use  . Smoking status: Current Every Day Smoker    Packs/day: 0.20    Types: Cigarettes  . Smokeless tobacco: Never Used  Vaping Use  . Vaping Use: Never used  Substance Use Topics  . Alcohol use: Yes    Comment: occ  . Drug use: No    Home Medications Prior to Admission medications   Medication Sig Start Date End Date  Taking? Authorizing Provider  hydrOXYzine (ATARAX/VISTARIL) 25 MG tablet Take 1 tablet (25 mg total) by mouth every 6 (six) hours as needed for anxiety. 09/09/19   Long, Arlyss Repress, MD  lactulose (CEPHULAC) 10 g packet Take 1 packet (10 g total) by mouth 3 (three) times daily. 02/09/19   Rolan Bucco, MD  naproxen (NAPROSYN) 375 MG tablet Take 1 tablet (375 mg total) by mouth 2 (two) times daily for 7 days. 09/14/19 09/21/19  Heron Pitcock E, PA-C  polyethylene glycol powder (GLYCOLAX/MIRALAX) powder Take 17 g by mouth daily. 05/11/17   Mayo, Allyn Kenner, MD  senna-docusate (SENOKOT-S) 8.6-50 MG tablet Take 1 tablet by mouth daily. 05/11/17   Mayo, Allyn Kenner, MD    Allergies    Penicillins  Review of Systems   Review of Systems All other systems are reviewed and are negative for acute change except as noted in the HPI.  Physical Exam Updated Vital Signs BP 130/80 (BP Location: Left Arm)   Pulse 71   Temp 98.5 F (36.9 C) (Oral)   Resp 18   Ht 5\' 8"  (1.727 m)   Wt 119.3 kg   SpO2 97%   BMI 39.99 kg/m   Physical Exam Vitals and nursing note reviewed.  Constitutional:      Appearance: He is well-developed. He is not ill-appearing or toxic-appearing.  HENT:  Head: Normocephalic and atraumatic.     Comments: No tenderness to palpation of skull. No deformities or crepitus noted. No open wounds, abrasions or lacerations.    Nose: Nose normal.  Eyes:     General: No scleral icterus.       Right eye: No discharge.        Left eye: No discharge.     Conjunctiva/sclera: Conjunctivae normal.  Neck:     Vascular: No JVD.  Cardiovascular:     Rate and Rhythm: Normal rate and regular rhythm.     Pulses: Normal pulses.     Heart sounds: Normal heart sounds.  Pulmonary:     Effort: Pulmonary effort is normal.     Breath sounds: Normal breath sounds.  Abdominal:     General: There is no distension.  Musculoskeletal:        General: Normal range of motion.     Cervical back: Normal  range of motion.  Skin:    General: Skin is warm and dry.  Neurological:     Mental Status: He is oriented to person, place, and time.     GCS: GCS eye subscore is 4. GCS verbal subscore is 5. GCS motor subscore is 6.     Comments: Speech is clear and goal oriented, follows commands CN III-XII intact, no facial droop Normal strength in upper and lower extremities bilaterally including dorsiflexion and plantar flexion, strong and equal grip strength Sensation normal to light and sharp touch Moves extremities without ataxia, coordination intact Normal finger to nose and rapid alternating movements Normal gait and balance  Psychiatric:        Behavior: Behavior normal.     ED Results / Procedures / Treatments   Labs (all labs ordered are listed, but only abnormal results are displayed) Labs Reviewed - No data to display  EKG None  Radiology CT Head Wo Contrast  Result Date: 09/14/2019 CLINICAL DATA:  Head trauma, normal mental status. EXAM: CT HEAD WITHOUT CONTRAST TECHNIQUE: Contiguous axial images were obtained from the base of the skull through the vertex without intravenous contrast. COMPARISON:  None FINDINGS: Brain: No evidence of acute infarction, hemorrhage, hydrocephalus, extra-axial collection or mass lesion/mass effect. Vascular: No hyperdense vessel or unexpected calcification. Skull: Normal. Negative for fracture or focal lesion. Sinuses/Orbits: Normal to the extent evaluated. Incompletely imaged both orbits and paranasal sinuses. Other: None. IMPRESSION: No acute intracranial pathology. Electronically Signed   By: Donzetta Kohut M.D.   On: 09/14/2019 13:17    Procedures Procedures (including critical care time)  Medications Ordered in ED Medications  ketorolac (TORADOL) 15 MG/ML injection 15 mg (has no administration in time range)  acetaminophen (TYLENOL) tablet 650 mg (has no administration in time range)    ED Course  I have reviewed the triage vital signs and  the nursing notes.  Pertinent labs & imaging results that were available during my care of the patient were reviewed by me and considered in my medical decision making (see chart for details).    MDM Rules/Calculators/A&P                           History provided by patient with additional history obtained from chart review.    Patient with head injury which did not cause of loss of consciousness but with persistent headache since the initial trauma.  No evidence of skull fracture on physical exam. Patient is not taking anticoagulants, is less than 65  and has no history of subarachnoid or subdural hemorrhage. Patient denies nausea, vomiting, amnesia, vision changes,vertigo.  Patient with no focal neurological deficits on physical exam.  Discussed thoroughly symptoms to return to the emergency department including severe headaches, disequilibrium, vomiting, double vision, extremity weakness, difficulty ambulating, or any other concerning symptoms.   Discussed the likely etiology of patient's symptoms being concussive in nature.  Discussed the risk versus benefit of CT scan at this time I do not believe he warrants one, however patient is very concerned about his symptoms as he is a Naval architect. Head CT performed and is negative. Headache treated in the ED.  Patient will be discharged with information pertaining to diagnosis and advised to use over-the-counter medications like Tylenol  And prescription for naproxen for pain relief. Pt has also advised to not participate in contact sports until they are completely asymptomatic for at least 1 week or they are cleared by their doctor. Advised not to drive until he is asymptomatic. Also given information to follow up with concussion clinic.  The patient appears reasonably screened and/or stabilized for discharge and I doubt any other medical condition or other [facility] requiring further screening, evaluation, or treatment in the ED at this time prior to  discharge. The patient is safe for discharge with strict return precautions discussed.   Portions of this note were generated with Scientist, clinical (histocompatibility and immunogenetics). Dictation errors may occur despite best attempts at proofreading.   Final Clinical Impression(s) / ED Diagnoses Final diagnoses:  Injury of head, initial encounter    Rx / DC Orders ED Discharge Orders         Ordered    naproxen (NAPROSYN) 375 MG tablet  2 times daily        09/14/19 1327           Ruwayda Curet, Caroleen Hamman, PA-C 09/14/19 1333    Melene Plan, DO 09/14/19 1344

## 2019-09-14 NOTE — Discharge Instructions (Addendum)
Call Dr. Katrinka Blazing to schedule an outpatient appointment to further talk about concussions and possibly needing more  testing. I found 2 office locations for him, so both numbers have been included in your discharge paperwork. Call his office first thing tomorrow morning.  A work note has been included in your discharge paperwork. If you need more time off you will need to get the note from your primary care doctor or Dr. Katrinka Blazing.  -Prescription has been sent to your pharmacy for naproxen.  This is an anti-inflammatory.  Take it with food so it does not cause an upset stomach.  Do not take any additional Aleve, ibuprofen, Motrin as his medicines are all similar. You can take Tylenol at the same time as the naproxen.  Take as directed on the bottle.

## 2019-09-15 ENCOUNTER — Telehealth: Payer: Self-pay | Admitting: Family Medicine

## 2019-09-15 NOTE — Telephone Encounter (Signed)
Attempted to call patient but mailbox is full so could not leave message.

## 2019-09-15 NOTE — Telephone Encounter (Signed)
Referred by [facility] ED, seen this weekend for concussion symptoms. Concussion occurred approx 2 weeks ago. [phone]

## 2019-09-16 NOTE — Telephone Encounter (Signed)
Attempted to call patient. Mailbox full. Unable to leave voicemail.

## 2019-09-16 NOTE — Telephone Encounter (Signed)
Patient at a shoe store 3 weeks ago when he hit his head on shoe rack. Said that things "went black" for 4 seconds. Patient has been having an increase in anxiety and depression as well as constant headaches since injury. 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 No history of head injuries. Recommend to refrain from physical activity and anything that increases symptoms. Told to go into ED if symptoms become worse. On schedule on Thursday.

## 2019-09-18 ENCOUNTER — Ambulatory Visit (INDEPENDENT_AMBULATORY_CARE_PROVIDER_SITE_OTHER): Payer: BC Managed Care – PPO | Admitting: Family Medicine

## 2019-09-18 ENCOUNTER — Other Ambulatory Visit: Payer: Self-pay

## 2019-09-18 ENCOUNTER — Encounter: Payer: Self-pay | Admitting: Family Medicine

## 2019-09-18 VITALS — BP 110/80 | HR 66 | Ht 68.0 in | Wt 263.0 lb

## 2019-09-18 DIAGNOSIS — S060X9A Concussion with loss of consciousness of unspecified duration, initial encounter: Secondary | ICD-10-CM

## 2019-09-18 DIAGNOSIS — F411 Generalized anxiety disorder: Secondary | ICD-10-CM | POA: Diagnosis not present

## 2019-09-18 MED ORDER — CLONAZEPAM 0.5 MG PO TABS
0.5000 mg | ORAL_TABLET | Freq: Two times a day (BID) | ORAL | 3 refills | Status: DC
Start: 2019-09-18 — End: 2019-10-07

## 2019-09-18 MED ORDER — FLUOXETINE HCL 20 MG PO CAPS
ORAL_CAPSULE | ORAL | 1 refills | Status: DC
Start: 2019-09-18 — End: 2019-10-07

## 2019-09-18 NOTE — Progress Notes (Signed)
Subjective:    Chief Complaint: Grant Brown,  is a 29 y.o. male who presents for head injury sustained on 08/30/2019. He hit forehead on shoe rack at shoe store. LOC (+) for 4 seconds. Patient has been having constant headaches and whole body numbness. Patient feels like he is out of his body. Does use anxiety medications. Also notes that he has not been sleeping.    He is having significant anxiety prior to his head injury and worsening following his head injury.  His anxiety has been severe and causing panic attacks.  Has been unable to work because of this.  His primary care provider has been prescribing bupropion, buspirone, and hydroxyzine.  This has not been very helpful.  He has discontinued his bupropion buspirone and hydroxyzine as they were not helpful and were worsening his symptoms.  Injury date : 08/30/2019 Visit #:1   History of Present Illness:    Concussion Self-Reported Symptom Score Symptoms rated on a scale 1-6, in last 24 hours   Headache: 5    Nausea: 0  Dizziness: 5  Vomiting: 0  Balance Difficulty: 2   Trouble Falling Asleep: 6  Fatigue: 2  Sleep Less Than Usual: 6  Daytime Drowsiness:5  Sleep More Than Usual: 0  Photophobia: 4  Phonophobia: 5  Irritability: 5  Sadness: 4  Numbness or Tingling: 6  Nervousness: 3  Feeling More Emotional: 0  Feeling Mentally Foggy: 6  Feeling Slowed Down: 6  Memory Problems: 5  Difficulty Concentrating: 6  Visual Problems: 3   Total Symptom Score: 84   Neck Pain: Yes/No  Tinnitus: Yes/No  Review of Systems: No fevers or chills.  Significant anxiety.    Review of History: Anxiety  Objective:    Physical Examination Vitals:   09/18/19 1151  BP: 110/80  Pulse: 66  SpO2: 96%   MSK: Normal cervical motion Neuro: Normal coordination and gait Psych: Anxious.  Normal speech thought process and affect otherwise.  Imaging: CT Head Wo Contrast  Result Date: 09/14/2019 CLINICAL DATA:  Head trauma, normal  mental status. EXAM: CT HEAD WITHOUT CONTRAST TECHNIQUE: Contiguous axial images were obtained from the base of the skull through the vertex without intravenous contrast. COMPARISON:  None FINDINGS: Brain: No evidence of acute infarction, hemorrhage, hydrocephalus, extra-axial collection or mass lesion/mass effect. Vascular: No hyperdense vessel or unexpected calcification. Skull: Normal. Negative for fracture or focal lesion. Sinuses/Orbits: Normal to the extent evaluated. Incompletely imaged both orbits and paranasal sinuses. Other: None. IMPRESSION: No acute intracranial pathology. Electronically Signed   By: Donzetta Kohut M.D.   On: 09/14/2019 13:17   I, Clementeen Graham, personally (independently) visualized and performed the interpretation of the images attached in this note. Agree with radiology read  Assessment and Plan   29 y.o. male with anxiety.  Patient's anxiety symptoms were dominant prior to his concussion have been worsening slightly following his head injury/concussion.  I think the most important thing for Grant Brown is to treat his anxiety first.  His other concussion symptoms I think will improve considerably if we can get a better control of his anxiety.  His prior medication regimen was not effective and he had already discontinued it on his own.  Plan to switch to Prozac which may be a little more effective for anxiety than Wellbutrin.  Will bridge Prozac titration with short course of Klonopin.  Will use Klonopin twice daily to prevent panic attacks and to better control anxiety.  Patient will refer to behavioral health for  counseling.  Cognitive behavioral therapy will be helpful along with medications to better control his anxiety.  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  Out of work until recheck in 2 weeks.  I will send a letter to his primary care provider informing her of current treatment plan.      Action/Discussion: Reviewed diagnosis,  management options, expected outcomes, and the reasons for scheduled and emergent follow-up. Questions were adequately answered. Patient expressed verbal understanding and agreement with the following plan.     Patient Education:  Reviewed with patient the risks (i.e, a repeat concussion, post-concussion syndrome, second-impact syndrome) of returning to play prior to complete resolution, and thoroughly reviewed the signs and symptoms of concussion.Reviewed need for complete resolution of all symptoms, with rest AND exertion, prior to return to play.  Reviewed red flags for urgent medical evaluation: worsening symptoms, nausea/vomiting, intractable headache, musculoskeletal changes, focal neurological deficits.  [facility] Concussion Care Plan, which clearly outlines the plans stated above, was given to patient.   In addition to the time spent performing tests, I spent 30 min   Reviewed with patient the risks (i.e, a repeat concussion, post-concussion syndrome, second-impact syndrome) of returning to play prior to complete resolution, and thoroughly reviewed the signs and symptoms of      concussion. Reviewedf need for complete resolution of all symptoms, with rest AND exertion, prior to return to play.  Reviewed red flags for urgent medical evaluation: worsening symptoms, nausea/vomiting, intractable headache, musculoskeletal changes, focal neurological deficits.  [facility] Concussion Care Plan, which clearly outlines the plans stated above, was given to patient   After Visit Summary printed out and provided to patient as appropriate.  The above documentation has been reviewed and is accurate and complete Clementeen Graham

## 2019-09-18 NOTE — Patient Instructions (Addendum)
Thank you for coming in today. I think the main issue is anxiety.  Concussion can make this worse.  I think if we can get your anxiety better controlled your other symptoms will improve as well.  STOP wellbutrin and buspar.  Start prozac (fluoxetine) 20mg  daily for 1 week and increase to 2 pills (40 mg) daily.  Take klonopin 2x daily for immediate effect on anxity.  Recheck with  Me in 2 weeks.   Let me know sooner if this does not work well or you have a problem.   Therapy can help as well. I put a referral in.

## 2019-09-19 DIAGNOSIS — F411 Generalized anxiety disorder: Secondary | ICD-10-CM | POA: Diagnosis not present

## 2019-09-19 DIAGNOSIS — K5909 Other constipation: Secondary | ICD-10-CM | POA: Diagnosis not present

## 2019-09-24 ENCOUNTER — Encounter: Payer: Self-pay | Admitting: Family Medicine

## 2019-09-24 ENCOUNTER — Telehealth: Payer: Self-pay | Admitting: Family Medicine

## 2019-09-24 NOTE — Telephone Encounter (Signed)
Called pt, left VM advising that letter sent to him via VM and appt for 9/30 cancelled.

## 2019-09-24 NOTE — Telephone Encounter (Signed)
Pt called, feels much better and back to his normal self. Would like a note to allow him to return to work on Monday 9/27. Please email note to [email or web address] (  I am happy to do this )  Also, pt set to follow up on 9/30. Does he need to keep this appt?

## 2019-09-24 NOTE — Telephone Encounter (Signed)
Letter written and sent through my chart.  Copy of letter will be printed and scanned and emailed to patient at his email address of preference.  If he is feeling better he does not need to be seen on the 30th.  Okay to cancel that appointment.

## 2019-09-25 DIAGNOSIS — K5909 Other constipation: Secondary | ICD-10-CM | POA: Diagnosis not present

## 2019-10-02 ENCOUNTER — Ambulatory Visit: Payer: BC Managed Care – PPO | Admitting: Family Medicine

## 2019-10-02 ENCOUNTER — Telehealth: Payer: Self-pay | Admitting: Family Medicine

## 2019-10-02 NOTE — Telephone Encounter (Signed)
Patient calling for status of Short Term Disability paperwork he sent over recently.

## 2019-10-03 NOTE — Telephone Encounter (Signed)
STD paperwork found, was faxed to [facility] claims on 09/21/2019. Copy mailed to pt home.

## 2019-10-06 ENCOUNTER — Telehealth: Payer: Self-pay | Admitting: Family Medicine

## 2019-10-06 NOTE — Telephone Encounter (Signed)
Patient called regarding the prescription for Clonazepam. He said that he has been having to take 2 to notice relief for his anxiety. He also said that he has had to take another one in the afternoon to help him sleep. He wanted to know if the dose could be increased or what Dr Denyse Amass would recommend?

## 2019-10-06 NOTE — Telephone Encounter (Signed)
That is increasing dose of clonazepam.  Recommend scheduling follow-up appointment with me soon.

## 2019-10-06 NOTE — Telephone Encounter (Signed)
Appointment scheduled.

## 2019-10-06 NOTE — Telephone Encounter (Signed)
Can you schedule patient a follow up to discuss upping the dosage

## 2019-10-07 ENCOUNTER — Ambulatory Visit (INDEPENDENT_AMBULATORY_CARE_PROVIDER_SITE_OTHER): Payer: BC Managed Care – PPO | Admitting: Family Medicine

## 2019-10-07 DIAGNOSIS — F411 Generalized anxiety disorder: Secondary | ICD-10-CM

## 2019-10-07 MED ORDER — CLONAZEPAM 0.5 MG PO TABS
ORAL_TABLET | ORAL | 0 refills | Status: DC
Start: 2019-10-07 — End: 2019-11-17

## 2019-10-07 MED ORDER — FLUOXETINE HCL 40 MG PO CAPS
40.0000 mg | ORAL_CAPSULE | Freq: Every day | ORAL | 1 refills | Status: AC
Start: 2019-10-07 — End: 2019-10-14

## 2019-10-07 NOTE — Patient Instructions (Signed)
Thank you for coming in today.  Increase the prozac to 40mg  daily.  Increase the klonoipin to 2 pills in the morning and 1 pill in evening.

## 2019-10-07 NOTE — Progress Notes (Signed)
Virtual Visit  I connected with   Grant Brown  by a phone telemedicine application and verified that I am speaking with the correct person using two identifiers.   I discussed the limitations of evaluation and management by telemedicine and the availability of in person appointments. The patient expressed understanding and agreed to proceed.  ? Location of the provider office ? Location of the patient home ? The names and roles of all persons participating in the visit. Pt and self   History of Present Illness: Grant Brown is a 29 y.o. male who would like to discuss anxiety and a concussion. Patient was seen for concussion on September 16 and was found to have significant anxiety. His dominant finding was generalized anxiety disorder. He was prescribed Prozac titrating from 10 to 20 mg daily and Klonopin 0.5 mg twice daily. In the interim he notes that the Klonopin has been helpful. He cannot tell if the Prozac is helped any yet. He notes that he tends to have to take 1 mg of Klonopin in the morning and sometimes 0.5 mg at bedtime. His primary care provider did also refer him to psychiatry. He had an initial video visit that did not connect well and is rescheduled in the near future. He remains unable to work. 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 He rates his anxiety as severe and also expresses anhedonia and loss of desire and ability to do things that he normally likes to do. He denies any suicidal ideation or plans.   Observations/Objective:  Normal Speech.     Assessment and Plan: 29 y.o. male with anxiety. Dominant finding today. Doubtful how much of this is really related to concussion. Plan to continue to titrate Prozac to 40 mg daily and slowly increase Klonopin to 1 mg in the morning and 0.5 mg at bedtime. Stressed the importance of follow-up with his psychiatrist.. Plan to recheck the phone visit in 2 weeks. We will extend short-term  disability work note if needed.  PDMP not reviewed this encounter. No orders of the defined types were placed in this encounter.  Meds ordered this encounter  Medications  . FLUoxetine (PROZAC) 40 MG capsule    Sig: Take 1 capsule (40 mg total) by mouth daily for 7 days.    Dispense:  90 capsule    Refill:  1  . clonazePAM (KLONOPIN) 0.5 MG tablet    Sig: 2 pills po in the AM and 1 pill po in the PM. 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 The patient was provided an opportunity to ask questions and all were answered. The patient agreed with the plan and demonstrated an understanding of the instructions.   The patient was advised to call back or seek an in-person evaluation if the symptoms worsen or if the condition fails to improve as anticipated.  Time: 21 mins    Historical information moved to improve visibility of documentation.  Past Medical History:  Diagnosis Date  . Cerebral palsy (HCC)    No past surgical history on file. Social History   Tobacco Use  . Smoking status: Current Every Day Smoker    Packs/day: 0.20    Types: Cigarettes  . Smokeless tobacco: Never Used  Substance Use Topics  . Alcohol use:  Yes    Comment: occ   family history is not on file.  Medications: Current Outpatient Medications  Medication Sig Dispense Refill  . clonazePAM (KLONOPIN) 0.5 MG tablet 2 pills po in the AM and 1 pill po in the PM. 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 FLUoxetine (PROZAC) 40 MG capsule Take 1 capsule (40 mg total) by mouth daily for 7 days. 90 capsule 1  . lactulose (CEPHULAC) 10 g packet Take 1 packet (10 g total) by mouth 3 (three) times daily. 20 each 0  . polyethylene glycol powder (GLYCOLAX/MIRALAX) powder Take 17 g by mouth daily. 500 g 1  . senna-docusate (SENOKOT-S) 8.6-50 MG tablet Take 1 tablet by mouth  daily. 60 tablet 1   No current facility-administered medications for this visit.   Allergies  Allergen Reactions  . Penicillins

## 2019-10-21 ENCOUNTER — Encounter: Payer: Self-pay | Admitting: Family Medicine

## 2019-10-21 ENCOUNTER — Ambulatory Visit (INDEPENDENT_AMBULATORY_CARE_PROVIDER_SITE_OTHER): Payer: BC Managed Care – PPO | Admitting: Family Medicine

## 2019-10-21 DIAGNOSIS — S060X9A Concussion with loss of consciousness of unspecified duration, initial encounter: Secondary | ICD-10-CM

## 2019-10-21 DIAGNOSIS — F411 Generalized anxiety disorder: Secondary | ICD-10-CM | POA: Diagnosis not present

## 2019-10-21 MED ORDER — TRAZODONE HCL 50 MG PO TABS
50.0000 mg | ORAL_TABLET | Freq: Every evening | ORAL | 3 refills | Status: AC | PRN
Start: 2019-10-21 — End: ?

## 2019-10-21 NOTE — Progress Notes (Signed)
Virtual Visit  I connected with   Grant Brown  by a phone telemedicine application and verified that I am speaking with the correct person using two identifiers.   I discussed the limitations of evaluation and management by telemedicine and the availability of in person appointments. The patient expressed understanding and agreed to proceed.  ? Location of the provider Office ? Location of the patient Home ? The names and roles of all persons participating in the visit. Patient and myself   History of Present Illness: Grant Brown is a 29 y.o. male who would like to discuss anxiety related to concussion.   Overall he is doing better than he has been previously.  He has been able to return to work.  He is currently taking Prozac 40 mg daily and Klonopin 1 to 3 pills during the day.  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  He notes he is having some trouble sleeping.  His PCP did refer to psychiatry but he had a confusing video visit and is not sure if she should check back with them or not.  He had considerable difficulty communicating with the person during the video visit and is not sure what the next steps should be.  He feels less foggy and fuzzy and overall much improved.   Observations/Objective: Exam: Normal Speech.    Assessment and Plan: 29 y.o. male with anxiety related to concussion.  Also effectively generalized anxiety disorder.  Currently on Prozac 40 doing reasonably well.  Work on tapering off of the [employer].  Will prescribe trazodone to take at bedtime as needed for insomnia.  Stressed the importance of following up with psychiatry and provided him with the phone number where he was referred by his PCP to the psychiatry office.  He should transfer his care to psychiatry for his anxiety disorder in the future.  Will check back with him in a month if needed.  Follow Up Instructions:    I discussed the  assessment and treatment plan with the patient. The patient was provided an opportunity to ask questions and all were answered. The patient agreed with the plan and demonstrated an understanding of the instructions.   The patient was advised to call back or seek an in-person evaluation if the symptoms worsen or if the condition fails to improve as anticipated.  Time: 21 mins. Discussed treatment and plan as above.     Historical information moved to improve visibility of documentation.  Past Medical History:  Diagnosis Date  . Cerebral palsy (HCC)    No past surgical history on file. Social History   Tobacco Use  . Smoking status: Current Every Day Smoker    Packs/day: 0.20    Types: Cigarettes  . Smokeless tobacco: Never Used  Substance Use Topics  . Alcohol use: Yes    Comment: occ   family history is not on file.  Medications: Current Outpatient Medications  Medication Sig Dispense Refill  . clonazePAM (KLONOPIN) 0.5 MG tablet 2 pills po in the AM and 1 pill po in the PM. 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 FLUoxetine (PROZAC) 40 MG capsule Take 1 capsule (40 mg total) by mouth daily for 7 days. 90 capsule 1  . lactulose (CEPHULAC) 10 g packet Take 1 packet (10 g total) by mouth 3 (three) times daily. 20 each 0  . polyethylene glycol powder (GLYCOLAX/MIRALAX) powder  Take 17 g by mouth daily. 500 g 1  . senna-docusate (SENOKOT-S) 8.6-50 MG tablet Take 1 tablet by mouth daily. 60 tablet 1   No current facility-administered medications for this visit.   Allergies  Allergen Reactions  . Penicillins

## 2019-10-21 NOTE — Patient Instructions (Signed)
Thank you for coming in today.  Recheck in about 1 month if needed.   Try to taper off Klonipin  Use trazodone at bedtime as needed for sleep.   Continue the prozac.   Follow up with psychiatry.   [id number]  [address]  [place], [place] [address]  Phone: [phone]  Fax: [phone]

## 2019-11-14 ENCOUNTER — Encounter (HOSPITAL_BASED_OUTPATIENT_CLINIC_OR_DEPARTMENT_OTHER): Payer: Self-pay | Admitting: *Deleted

## 2019-11-14 ENCOUNTER — Emergency Department (HOSPITAL_BASED_OUTPATIENT_CLINIC_OR_DEPARTMENT_OTHER)
Admission: EM | Admit: 2019-11-14 | Discharge: 2019-11-14 | Disposition: A | Discharge status: Home / Self Care | Payer: BC Managed Care – PPO | Attending: Emergency Medicine | Admitting: Emergency Medicine

## 2019-11-14 ENCOUNTER — Other Ambulatory Visit: Payer: Self-pay

## 2019-11-14 DIAGNOSIS — F17210 Nicotine dependence, cigarettes, uncomplicated: Secondary | ICD-10-CM | POA: Insufficient documentation

## 2019-11-14 DIAGNOSIS — R059 Cough, unspecified: Secondary | ICD-10-CM | POA: Diagnosis not present

## 2019-11-14 DIAGNOSIS — Z20822 Contact with and (suspected) exposure to covid-19: Secondary | ICD-10-CM | POA: Insufficient documentation

## 2019-11-14 DIAGNOSIS — Z202 Contact with and (suspected) exposure to infections with a predominantly sexual mode of transmission: Secondary | ICD-10-CM | POA: Insufficient documentation

## 2019-11-14 DIAGNOSIS — R369 Urethral discharge, unspecified: Secondary | ICD-10-CM | POA: Diagnosis not present

## 2019-11-14 LAB — RESPIRATORY PANEL BY RT PCR (FLU A&B, COVID)
Influenza A by PCR: NEGATIVE
Influenza B by PCR: NEGATIVE
SARS Coronavirus 2 by RT PCR: NEGATIVE

## 2019-11-14 MED ORDER — AZITHROMYCIN 250 MG PO TABS
1000.0000 mg | ORAL_TABLET | Freq: Once | ORAL | Status: AC
  Administered 2019-11-14: 1000 mg via ORAL
  Filled 2019-11-14: qty 4

## 2019-11-14 NOTE — ED Provider Notes (Signed)
[facility] EMERGENCY DEPARTMENT Provider Note   CSN: [id number] Arrival date & time: 11/14/19  1731     History Exposure to STD   Grant Brown is a 29 y.o. male with past medical history who presents for evaluation of CD exposure. Patient states significant other tested positive for chlamydia. He states today he developed some light yellow discharge to distal tip of penis. He denies any pain, pain with bowel movements, testicular redness, swelling, warmth. No rashes or lesions. No abdominal pain, fever, chills, nausea, vomiting. Denies additional aggravating or alleviating factors. Sexually active and adamantly uses protection.  States he is also developed a cough over the last 2 days. No known Covid exposures. He has no chest pain, shortness of breath, hemoptysis, congestion, rhinorrhea, fever, chills, nausea or vomiting. No lateral leg swelling, redness or warmth. Would like to be tested for Covid.  History obtained from patient and past medical records. No interpretor was used.  HPI     Past Medical History:  Diagnosis Date  . Cerebral palsy [facility])     Patient Active Problem List   Diagnosis Date Noted  . Vitamin D deficiency 03/11/2018  . Fatigue 03/07/2018  . 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 Abnormal x-ray of lumbar spine 03/06/2018    History reviewed. No pertinent surgical history.     No family history on file.  Social History   Tobacco Use  . Smoking status: Current Every Day Smoker    Packs/day: 0.20    Types: Cigarettes  . Smokeless tobacco: Never Used  Vaping Use  . Vaping Use: Never used  Substance Use Topics  . Alcohol use: Yes    Comment: occ  . Drug use: No    Home Medications Prior to Admission medications   Medication Sig Start Date End Date Taking? Authorizing Provider  clonazePAM (KLONOPIN) 0.5 MG tablet 2 pills po in the AM and 1 pill po in the PM. 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 10/07/19 10/14/19  Rodolph Bong, MD  lactulose (CEPHULAC) 10 g packet Take 1 packet (10 g total) by mouth 3 (three) times daily. 02/09/19   Rolan Bucco, MD  polyethylene glycol powder (GLYCOLAX/MIRALAX) powder Take 17 g by mouth daily. 05/11/17   Mayo, Allyn Kenner, MD  senna-docusate (SENOKOT-S) 8.6-50 MG tablet Take 1 tablet by mouth daily. 05/11/17   Mayo, Allyn Kenner, MD  traZODone (DESYREL) 50 MG tablet Take 1 tablet (50 mg total) by mouth at bedtime as needed for sleep. 10/21/19   Rodolph Bong, MD    Allergies    Penicillins  Review of Systems   Review of Systems  Constitutional: Negative.   HENT: Negative.   Respiratory: Positive for cough. Negative for apnea, choking, chest tightness, shortness of breath, wheezing and stridor.   Cardiovascular: Negative.   Gastrointestinal: Negative.   Genitourinary: Positive for discharge. Negative for decreased urine volume, difficulty urinating, dysuria, enuresis, flank pain, frequency, genital sores, hematuria, penile pain, penile swelling, scrotal swelling, testicular pain and urgency.  Musculoskeletal: Negative.   Skin: Negative.   Neurological: Negative.   All other systems reviewed and are negative.   Physical Exam Updated Vital Signs BP 140/70   Pulse 78   Temp 98 F (36.7 C)   Resp 18   Ht 5\' 8"  (1.727 m)   Wt  119.3 kg   SpO2 99%   BMI 39.99 kg/m   Physical Exam Vitals and nursing note reviewed.  Constitutional:      General: He is not in acute distress.    Appearance: He is well-developed. He is not ill-appearing, toxic-appearing or diaphoretic.  HENT:     Head: Normocephalic and atraumatic.     Nose: Nose normal.     Mouth/Throat:     Mouth: Mucous membranes are moist.  Eyes:     Pupils: Pupils are equal, round, and reactive to light.  Cardiovascular:     Rate and Rhythm: Normal rate and regular rhythm.      Pulses: Normal pulses.     Heart sounds: Normal heart sounds.  Pulmonary:     Effort: Pulmonary effort is normal. No respiratory distress.     Breath sounds: Normal breath sounds.     Comments: Speaks in full sentences without difficulty. Lungs clear to auscultation bilaterally. Abdominal:     General: Bowel sounds are normal. There is no distension.     Palpations: Abdomen is soft.  Genitourinary:    Comments: Declined GU exam Musculoskeletal:        General: Normal range of motion.     Cervical back: Normal range of motion and neck supple.     Comments: Moves all 4 extremities at difficulty. Denna Haggard' sign negative. Compartments soft. No bony tenderness.  Skin:    General: Skin is warm and dry.     Capillary Refill: Capillary refill takes less than 2 seconds.     Comments: Tactile temperature to extremities.  Neurological:     General: No focal deficit present.     Mental Status: He is alert and oriented to person, place, and time.     ED Results / Procedures / Treatments   Labs (all labs ordered are listed, but only abnormal results are displayed) Labs Reviewed  RESPIRATORY PANEL BY RT PCR (FLU A&B, COVID)  GC/CHLAMYDIA PROBE AMP ([place]) NOT AT [facility]    EKG None  Radiology No results found.  Procedures Procedures (including critical care time)  Medications Ordered in ED Medications  azithromycin (ZITHROMAX) tablet 1,000 mg (1,000 mg Oral Given 11/14/19 1936)    ED Course  I have reviewed the triage vital signs and the nursing notes.  Pertinent labs & imaging results that were available during my care of the patient were reviewed by me and considered in my medical decision making (see chart for details).  29 year old presents for evaluation of exposure to STD. Exposed to chlamydia and partner. He denies any pain, dysuria. Did note yellow discharge at urethral meatus earlier today. He declines GU exam here today.  Patient is afebrile without abdominal  tenderness, abdominal pain or painful bowel movements to indicate prostatitis.  Denies pain to  testes or epididymis to suggest orchitis or epididymitis.  STD cultures obtained including  gonorrhea and chlamydia. Patient to be discharged with instructions to follow up with PCP. Discussed importance of using protection when sexually active. Pt understands that they have GC/Chlamydia cultures pending and that they will need to inform all sexual partners if results return positive. Patient treated for Chlamydia in ED given known exposure.  Patient also with cough. Would like to be tested for Covid. No chest pain, shortness of breath. Lungs clear to auscultation bilateral without wheeze, rhonchi or rales. No tachycardia, tachypnea or hypoxia. Will test him for Covid. He'll follow outpatient with PCP.  The patient has been appropriately medically screened  and/or stabilized in the ED. I have low suspicion for any other emergent medical condition which would require further screening, evaluation or treatment in the ED or require inpatient management.  Patient is hemodynamically stable and in no acute distress.  Patient able to ambulate in department prior to ED.  Evaluation does not show acute pathology that would require ongoing or additional emergent interventions while in the emergency department or further inpatient treatment.  I have discussed the diagnosis with the patient and answered all questions.  Pain is been managed while in the emergency department and patient has no further complaints prior to discharge.  Patient is comfortable with plan discussed in room and is stable for discharge at this time.  I have discussed strict return precautions for returning to the emergency department.  Patient was encouraged to follow-up with PCP/specialist refer to at discharge.    MDM Rules/Calculators/A&P                          Grant Brown was evaluated in Emergency Department on 11/14/2019 for the symptoms  described in the history of present illness. He was evaluated in the context of the global COVID-19 pandemic, which necessitated consideration that the patient might be at risk for infection with the SARS-CoV-2 virus that causes COVID-19. Institutional protocols and algorithms that pertain to the evaluation of patients at risk for COVID-19 are in a state of rapid change based on information released by regulatory bodies including the CDC and federal and state organizations. These policies and algorithms were followed during the patient's care in the ED. Final Clinical Impression(s) / ED Diagnoses Final diagnoses:  Exposure to STD  Penile discharge    Rx / DC Orders ED Discharge Orders    None       Trammell Bowden A, PA-C 11/14/19 1942    Terrilee Files, MD 11/15/19 1027

## 2019-11-14 NOTE — Discharge Instructions (Signed)
You will be called for your Covid test or your STD test that they're positive. We'll treat you with antibiotics for your possible chlamydia exposure.  Return for any worsening symptoms.

## 2019-11-14 NOTE — ED Triage Notes (Signed)
STD exposure. Penile discharge.

## 2019-11-16 ENCOUNTER — Other Ambulatory Visit: Payer: Self-pay | Admitting: Family Medicine

## 2019-11-17 LAB — GC/CHLAMYDIA PROBE AMP (~~LOC~~) NOT AT ARMC
Chlamydia: NEGATIVE
Comment: NEGATIVE
Comment: NORMAL
Neisseria Gonorrhea: POSITIVE — AB

## 2019-12-02 ENCOUNTER — Encounter: Payer: Self-pay | Admitting: Family Medicine

## 2019-12-03 MED ORDER — CLONAZEPAM 0.5 MG PO TABS: 0.5000 mg | ORAL_TABLET | Freq: Every evening | ORAL | 0 refills | Status: DC | PRN

## 2019-12-04 ENCOUNTER — Other Ambulatory Visit: Payer: Self-pay | Admitting: Family Medicine

## 2019-12-11 ENCOUNTER — Other Ambulatory Visit: Payer: Self-pay

## 2019-12-11 MED ORDER — CLONAZEPAM 0.5 MG PO TABS: 0.5000 mg | ORAL_TABLET | Freq: Every evening | ORAL | 0 refills | Status: AC | PRN

## 2019-12-11 NOTE — Telephone Encounter (Signed)
Patient called the clonazepam was sent to the wrong location. It needs to go to [place] on [address]

## 2019-12-15 NOTE — Telephone Encounter (Signed)
Pt is in [place] for work and asked if the prescription could be sent to the [facility] near him.   [facility]: [address] [place], [place]  Please advise.

## 2019-12-17 DIAGNOSIS — R0789 Other chest pain: Secondary | ICD-10-CM | POA: Diagnosis not present

## 2019-12-17 DIAGNOSIS — R079 Chest pain, unspecified: Secondary | ICD-10-CM | POA: Diagnosis not present

## 2019-12-17 DIAGNOSIS — R0602 Shortness of breath: Secondary | ICD-10-CM | POA: Diagnosis not present

## 2019-12-17 DIAGNOSIS — Z202 Contact with and (suspected) exposure to infections with a predominantly sexual mode of transmission: Secondary | ICD-10-CM | POA: Diagnosis not present

## 2019-12-17 DIAGNOSIS — F411 Generalized anxiety disorder: Secondary | ICD-10-CM | POA: Diagnosis not present

## 2019-12-17 DIAGNOSIS — R200 Anesthesia of skin: Secondary | ICD-10-CM | POA: Diagnosis not present

## 2020-01-30 DIAGNOSIS — F410 Panic disorder [episodic paroxysmal anxiety] without agoraphobia: Secondary | ICD-10-CM | POA: Diagnosis not present

## 2020-01-30 DIAGNOSIS — F411 Generalized anxiety disorder: Secondary | ICD-10-CM | POA: Diagnosis not present

## 2020-01-30 DIAGNOSIS — F332 Major depressive disorder, recurrent severe without psychotic features: Secondary | ICD-10-CM | POA: Diagnosis not present

## 2020-06-09 ENCOUNTER — Other Ambulatory Visit: Payer: Self-pay

## 2020-06-09 ENCOUNTER — Emergency Department (HOSPITAL_BASED_OUTPATIENT_CLINIC_OR_DEPARTMENT_OTHER)
Admission: EM | Admit: 2020-06-09 | Discharge: 2020-06-09 | Disposition: A | Discharge status: Home / Self Care | Payer: Medicaid Other | Attending: Emergency Medicine | Admitting: Emergency Medicine

## 2020-06-09 ENCOUNTER — Encounter (HOSPITAL_BASED_OUTPATIENT_CLINIC_OR_DEPARTMENT_OTHER): Payer: Self-pay

## 2020-06-09 DIAGNOSIS — F17210 Nicotine dependence, cigarettes, uncomplicated: Secondary | ICD-10-CM | POA: Diagnosis not present

## 2020-06-09 DIAGNOSIS — L02214 Cutaneous abscess of groin: Secondary | ICD-10-CM | POA: Diagnosis present

## 2020-06-09 MED ORDER — DOXYCYCLINE HYCLATE 100 MG PO TABS
100.0000 mg | ORAL_TABLET | Freq: Once | ORAL | Status: AC
  Administered 2020-06-09: 100 mg via ORAL
  Filled 2020-06-09: qty 1

## 2020-06-09 MED ORDER — LIDOCAINE-EPINEPHRINE 2 %-1:100000 IJ SOLN: 20.0000 mL | Freq: Once | INTRAMUSCULAR | Status: DC

## 2020-06-09 MED ORDER — POLYETHYLENE GLYCOL 3350 17 G PO PACK: PACK | ORAL | 0 refills | Status: AC

## 2020-06-09 MED ORDER — DOXYCYCLINE HYCLATE 100 MG PO CAPS: 100.0000 mg | ORAL_CAPSULE | Freq: Two times a day (BID) | ORAL | 0 refills | Status: AC

## 2020-06-09 NOTE — ED Triage Notes (Signed)
Pt c/o right posterior thigh abscess x 2 days-grimacing-slow gait

## 2020-06-09 NOTE — ED Provider Notes (Addendum)
[facility] Provider Note: Grant Dell, MD, FACEP  CSN: [id number] MRN: [id number] ARRIVAL: 06/09/20 at 2229 ROOM: [facility]05/[facility]05   CHIEF COMPLAINT  Abscess   HISTORY OF PRESENT ILLNESS  06/09/20 10:57 PM Grant Brown is a 30 y.o. male with a tender swollen area to his groin fold for 2 days.  He rates associated pain as a 10 out of 10, worse with movement or with ambulation.  The lesion is not pointing nor has it been draining.  The patient states he has been constipated and is insisting on a prescription for a laxative.   Past Medical History:  Diagnosis Date  . Cerebral palsy ([facility])     History reviewed. No pertinent surgical history.  No family history on file.  Social History   Tobacco Use  . Smoking status: Current Every Day Smoker    Packs/day: 0.20    Types: Cigarettes  . Smokeless tobacco: Never Used  Vaping Use  . Vaping Use: Never used  Substance Use Topics  . Alcohol use: Yes    Comment: occ  . Drug use: No    Prior to Admission medications   Medication Sig Start Date End Date Taking? Authorizing Provider  doxycycline (VIBRAMYCIN) 100 MG capsule Take 1 capsule (100 mg total) by mouth 2 (two) times daily. One po bid x 7 days 06/09/20  Yes Vickey Boak, MD  clonazePAM (KLONOPIN) 0.5 MG tablet Take 1 tablet (0.5 mg total) by mouth at bedtime as needed for anxiety. Tapering dose down 12/11/19   Rodolph Bong, MD  FLUoxetine (PROZAC) 40 MG capsule Take 1 capsule (40 mg total) by mouth daily for 7 days. 10/07/19 10/14/19  Rodolph Bong, MD  lactulose (CEPHULAC) 10 g packet Take 1 packet (10 g total) by mouth 3 (three) times daily. 02/09/19   Rolan Bucco, MD  polyethylene glycol powder (GLYCOLAX/MIRALAX) powder Take 17 g by mouth daily. 05/11/17   Mayo, Allyn Kenner, MD  senna-docusate (SENOKOT-S) 8.6-50 MG tablet Take 1 tablet by mouth daily. 05/11/17   Mayo, Allyn Kenner, MD  traZODone (DESYREL) 50 MG tablet Take 1 tablet (50 mg total) by mouth at bedtime as  needed for sleep. 10/21/19   Rodolph Bong, MD    Allergies Penicillins   REVIEW OF SYSTEMS  Negative except as noted here or in the History of Present Illness.   PHYSICAL EXAMINATION  Initial Vital Signs Blood pressure 117/85, pulse (!) 105, temperature 99 F (37.2 C), temperature source Oral, resp. rate 20, height 5\' 8"  (1.727 m), weight 122 kg, SpO2 94 %.  Examination General: Well-developed, well-nourished male in no acute distress; appearance consistent with age of record HENT: normocephalic; atraumatic Eyes: Normal appearance Neck: supple Heart: regular rate and rhythm Lungs: clear to auscultation bilaterally Abdomen: soft; nondistended; nontender; bowel sounds present Extremities: No deformity; full range of motion; pulses normal Neurologic: Awake, alert and oriented; motor function intact in all extremities and symmetric; no facial droop Skin: Warm and dry; tender, indurated area right groin fold, no pus pocket seen on bedside ultrasound Psychiatric: Anxious   RESULTS  Summary of this visit's results, reviewed and interpreted by myself:   EKG Interpretation  Date/Time:    Ventricular Rate:    PR Interval:    QRS Duration:   QT Interval:    QTC Calculation:   R Axis:     Text Interpretation:        Laboratory Studies: No results found for this or any previous visit (from  the past 24 hour(s)). Imaging Studies: No results found.  ED COURSE and MDM  Nursing notes, initial and subsequent vitals signs, including pulse oximetry, reviewed and interpreted by myself.  Vitals:   06/09/20 2236 06/09/20 2238  BP: 117/85   Pulse: (!) 105   Resp: 20   Temp: 99 F (37.2 C)   TempSrc: Oral   SpO2: 94%   Weight:  122 kg  Height:  5\' 8"  (1.727 m)   Medications  doxycycline (VIBRA-TABS) tablet 100 mg (has no administration in time range)    I do not see a pus pocket that would benefit from I&D at this time.  We will place the patient on doxycycline and have  him return if symptoms worsen over the next 2 to 3 days.  PROCEDURES  Procedures   ED DIAGNOSES     ICD-10-CM   1. Abscess of right groin  L02.214        11-20-1977, MD 06/09/20 2312    08/09/20, MD 06/09/20 2325

## 2020-06-09 NOTE — ED Notes (Signed)
Discharge instructions discussed with pt. Pt verbalized understanding. Pt stable and ambulatory. No signature pad available. 

## 2020-06-10 ENCOUNTER — Emergency Department: Admit: 2020-06-11 | Payer: PRIVATE HEALTH INSURANCE | Primary: Family Medicine

## 2020-06-10 DIAGNOSIS — L02415 Cutaneous abscess of right lower limb: Secondary | ICD-10-CM

## 2020-06-10 NOTE — ED Notes (Signed)
Pt c/o of abscess to the rt groin/perineal area for a "couple days"    States that he was seen at a hospital in [place] and given Doxycycline 100mg  but that the area is getting worse.    Denies drainage from the site

## 2020-06-11 ENCOUNTER — Inpatient Hospital Stay
Admit: 2020-06-11 | Discharge: 2020-06-11 | Disposition: A | Discharge status: Home / Self Care | Payer: PRIVATE HEALTH INSURANCE | Attending: Emergency Medicine

## 2020-06-11 LAB — SED RATE (ESR): Sed rate, automated: 25 mm/h

## 2020-06-11 LAB — CBC WITH AUTOMATED DIFF
ABS. BASOPHILS: 0 10*3/uL (ref 0.0–0.1)
ABS. EOSINOPHILS: 0.2 10*3/uL (ref 0.0–0.4)
ABS. IMM. GRANS.: 0.1 10*3/uL — ABNORMAL HIGH (ref 0.00–0.04)
ABS. LYMPHOCYTES: 1.6 10*3/uL (ref 0.8–3.5)
ABS. MONOCYTES: 1.3 10*3/uL — ABNORMAL HIGH (ref 0.0–1.0)
ABS. NEUTROPHILS: 12.3 10*3/uL — ABNORMAL HIGH (ref 1.8–8.0)
ABSOLUTE NRBC: 0 10*3/uL (ref 0.00–0.01)
BASOPHILS: 0 % (ref 0–1)
EOSINOPHILS: 1 % (ref 0–7)
HCT: 42 % (ref 36.6–50.3)
HGB: 14.6 g/dL (ref 12.1–17.0)
IMMATURE GRANULOCYTES: 0 % (ref 0.0–0.5)
LYMPHOCYTES: 10 % — ABNORMAL LOW (ref 12–49)
MCH: 29.2 pg (ref 26.0–34.0)
MCHC: 34.8 g/dL (ref 30.0–36.5)
MCV: 84 fL (ref 80.0–99.0)
MONOCYTES: 8 % (ref 5–13)
MPV: 10.2 fL (ref 8.9–12.9)
NEUTROPHILS: 80 % — ABNORMAL HIGH (ref 32–75)
NRBC: 0 /100{WBCs}
PLATELET: 324 10*3/uL (ref 150–400)
RBC: 5 M/uL (ref 4.10–5.70)
RDW: 12.8 % (ref 11.5–14.5)
WBC: 15.3 10*3/uL — ABNORMAL HIGH (ref 4.1–11.1)

## 2020-06-11 LAB — METABOLIC PANEL, COMPREHENSIVE
A-G Ratio: 1.3 (ref 1.1–2.2)
ALT (SGPT): 24 U/L (ref 10–50)
AST (SGOT): 19 U/L (ref 10–50)
Albumin: 4.3 g/dL (ref 3.5–5.2)
Alk. phosphatase: 56 U/L (ref 40–129)
Anion gap: 11 mmol/L (ref 5–15)
BUN/Creatinine ratio: 14 (ref 12–20)
BUN: 11 mg/dL (ref 6–20)
Bilirubin, total: 0.5 mg/dL (ref 0.2–1.0)
CO2: 26 mmol/L (ref 22–29)
Calcium: 9.5 mg/dL (ref 8.6–10.0)
Chloride: 102 mmol/L (ref 98–107)
Creatinine: 0.81 mg/dL (ref 0.70–1.20)
GFR est AA: >60 mL/min/{1.73_m2} (ref >60)
GFR est non-AA: >60 mL/min/{1.73_m2} (ref >60)
Globulin: 3.3 g/dL (ref 2.0–4.0)
Glucose: 103 mg/dL — ABNORMAL HIGH (ref 65–100)
Potassium: 3.9 mmol/L (ref 3.5–5.1)
Protein, total: 7.6 g/dL (ref 6.4–8.3)
Sodium: 139 mmol/L (ref 136–145)

## 2020-06-11 LAB — C REACTIVE PROTEIN, QT: C-Reactive protein: 7.05 mg/dL — ABNORMAL HIGH (ref <0.5)

## 2020-06-11 LAB — COMPREHENSIVE METABOLIC PANEL
ALT: 24 U/L (ref 10–50)
AST: 19 U/L (ref 10–50)
Albumin/Globulin Ratio: 1.3 (ref 1.1–2.2)
Albumin: 4.3 g/dL (ref 3.5–5.2)
Alkaline Phosphatase: 56 U/L (ref 40–129)
Anion Gap: 11 mmol/L (ref 5–15)
BUN: 11 MG/DL (ref 6–20)
Bun/Cre Ratio: 14 (ref 12–20)
CO2: 26 mmol/L (ref 22–29)
Calcium: 9.5 MG/DL (ref 8.6–10.0)
Chloride: 102 mmol/L (ref 98–107)
Creatinine: 0.81 MG/DL (ref 0.70–1.20)
EGFR IF NonAfrican American: >60 mL/min/{1.73_m2} (ref >60)
GFR African American: >60 mL/min/{1.73_m2} (ref >60)
Globulin: 3.3 g/dL (ref 2.0–4.0)
Glucose: 103 mg/dL — ABNORMAL HIGH (ref 65–100)
Potassium: 3.9 mmol/L (ref 3.5–5.1)
Sodium: 139 mmol/L (ref 136–145)
Total Bilirubin: 0.5 MG/DL (ref 0.2–1.0)
Total Protein: 7.6 g/dL (ref 6.4–8.3)

## 2020-06-11 LAB — CBC WITH AUTO DIFFERENTIAL
Basophils %: 0 % (ref 0–1)
Basophils Absolute: 0 10*3/uL (ref 0.0–0.1)
Eosinophils %: 1 % (ref 0–7)
Eosinophils Absolute: 0.2 10*3/uL (ref 0.0–0.4)
Granulocyte Absolute Count: 0.1 10*3/uL — ABNORMAL HIGH (ref 0.00–0.04)
Hematocrit: 42 % (ref 36.6–50.3)
Hemoglobin: 14.6 g/dL (ref 12.1–17.0)
Immature Granulocytes: 0 % (ref 0.0–0.5)
Lymphocytes %: 10 % — ABNORMAL LOW (ref 12–49)
Lymphocytes Absolute: 1.6 10*3/uL (ref 0.8–3.5)
MCH: 29.2 PG (ref 26.0–34.0)
MCHC: 34.8 g/dL (ref 30.0–36.5)
MCV: 84 FL (ref 80.0–99.0)
MPV: 10.2 FL (ref 8.9–12.9)
Monocytes %: 8 % (ref 5–13)
Monocytes Absolute: 1.3 10*3/uL — ABNORMAL HIGH (ref 0.0–1.0)
NRBC Absolute: 0 10*3/uL (ref 0.00–0.01)
Neutrophils %: 80 % — ABNORMAL HIGH (ref 32–75)
Neutrophils Absolute: 12.3 10*3/uL — ABNORMAL HIGH (ref 1.8–8.0)
Nucleated RBCs: 0 PER 100 WBC
Platelets: 324 10*3/uL (ref 150–400)
RBC: 5 M/uL (ref 4.10–5.70)
RDW: 12.8 % (ref 11.5–14.5)
WBC: 15.3 10*3/uL — ABNORMAL HIGH (ref 4.1–11.1)

## 2020-06-11 LAB — SEDIMENTATION RATE: Sed Rate: 25 mm/hr

## 2020-06-11 LAB — C-REACTIVE PROTEIN: CRP: 7.05 mg/dL — ABNORMAL HIGH (ref <0.5)

## 2020-06-11 MED ORDER — LIDOCAINE 4 % TOPICAL CREAM
4 % | CUTANEOUS | Status: AC
Start: 2020-06-11 — End: 2020-06-11
  Administered 2020-06-11: 05:00:00 via TOPICAL

## 2020-06-11 MED ORDER — MORPHINE 4 MG/ML INTRAVENOUS SOLUTION
4 mg/mL | INTRAVENOUS | Status: AC
Start: 2020-06-11 — End: 2020-06-10
  Administered 2020-06-11: 03:00:00 via INTRAVENOUS

## 2020-06-11 MED ORDER — CLINDAMYCIN 150 MG CAP
150 mg | ORAL_CAPSULE | Freq: Three times a day (TID) | ORAL | 0 refills | Status: AC
Start: 2020-06-11 — End: 2020-06-18

## 2020-06-11 MED ORDER — IOPAMIDOL 76 % IV SOLN
76 % | Freq: Once | INTRAVENOUS | Status: AC
Start: 2020-06-11 — End: 2020-06-11
  Administered 2020-06-11: 05:00:00 via INTRAVENOUS

## 2020-06-11 MED ORDER — LIDOCAINE 4 %-EPINEPHRINE 0.18 %-TETRACAINE 0.5 % TOPICAL GEL
CUTANEOUS | Status: DC
Start: 2020-06-11 — End: 2020-06-11

## 2020-06-11 MED ORDER — CLINDAMYCIN 300 MG/50 ML IN 0.9% SODIUM CHLORIDE IV PIGGYBACK
300 mg/50 mL | Freq: Once | INTRAVENOUS | Status: DC
Start: 2020-06-11 — End: 2020-06-11

## 2020-06-11 MED ORDER — LIDOCAINE (PF) 10 MG/ML (1 %) IJ SOLN
10 mg/mL (1 %) | Freq: Once | INTRAMUSCULAR | Status: DC
Start: 2020-06-11 — End: 2020-06-11

## 2020-06-11 MED ORDER — SODIUM CHLORIDE 0.9 % IV
Freq: Once | INTRAVENOUS | Status: AC
Start: 2020-06-11 — End: 2020-06-11
  Administered 2020-06-11: 03:00:00 via INTRAVENOUS

## 2020-06-11 MED ORDER — CLINDAMYCIN IN D5W 900 MG/50 ML IV PIGGY BACK
900 mg/50 mL | Freq: Once | INTRAVENOUS | Status: AC
Start: 2020-06-11 — End: 2020-06-11
  Administered 2020-06-11: 05:00:00 via INTRAVENOUS

## 2020-06-11 MED FILL — L.E.T. (LIDOCAINE-EPINEPHRINE BIT-TETRACAINE) 4 %-0.18 %-0.5 % TOP GEL: CUTANEOUS | Qty: 1

## 2020-06-11 MED FILL — MORPHINE 4 MG/ML SYRINGE: 4 mg/mL | INTRAMUSCULAR | Qty: 1

## 2020-06-11 MED FILL — CLINDAMYCIN IN D5W 900 MG/50 ML IV PIGGY BACK: 900 mg/50 mL | INTRAVENOUS | Qty: 50

## 2020-06-11 MED FILL — ISOVUE-370  76 % INTRAVENOUS SOLUTION: 370 mg iodine /mL (76 %) | INTRAVENOUS | Qty: 100

## 2020-06-11 MED FILL — SODIUM CHLORIDE 0.9 % IV: INTRAVENOUS | Qty: 1000

## 2020-06-11 MED FILL — LMX 4 4 % TOPICAL CREAM: 4 % | CUTANEOUS | Qty: 5

## 2020-06-11 MED FILL — XYLOCAINE-MPF 10 MG/ML (1 %) INJECTION SOLUTION: 10 mg/mL (1 %) | INTRAMUSCULAR | Qty: 2

## 2020-06-11 NOTE — ED Provider Notes (Signed)
ED Provider Notes by Stefan Church, MD at 06/11/20 0230                Author: Stefan Church, MD  Service: Emergency Medicine  Author Type: Physician       Filed: 06/13/20 2156  Date of Service: 06/11/20 0230  Status: Addendum          Editor: Stefan Church, MD (Physician)          Related Notes: Original Note by Stefan Church, MD (Physician) filed at 06/11/20 0432            Procedure Orders        1. I&D Abcess Complex [[id number]] ordered by Stefan Church, MD                              HPI       Patient is a 30 year old male who presents today for abscess to the upper thigh.  Symptom started 2 days ago.  Since then, the swelling and pain is gotten worse.  Patient started to have drainage from the wound once he arrived to the ED.  Patient was  brought to the ED for further evaluation.        Past Medical History:        Diagnosis  Date         ?  Anxiety             History reviewed. No pertinent surgical history.        History reviewed. No pertinent family history.        Social History          Socioeconomic History         ?  Marital status:  SINGLE              Spouse name:  Not on file         ?  Number of children:  Not on file     ?  Years of education:  Not on file     ?  Highest education level:  Not on file       Occupational History        ?  Not on file       Tobacco Use         ?  Smoking status:  Current Every Day Smoker              Packs/day:  0.50         Years:  15.00         Pack years:  7.50         ?  Smokeless tobacco:  Never Used       Substance and Sexual Activity         ?  Alcohol use:  Never     ?  Drug use:  Never     ?  Sexual activity:  Not on file        Other Topics  Concern        ?  Not on file       Social History Narrative        ?  Not on file          Social Determinants of Health          Financial Resource Strain:         ?  Difficulty of Paying Living Expenses:  Not on file       Food Insecurity:         ?  Worried About Running Out of Food  in the Last Year: Not on file     ?  Ran Out of Food in the Last Year: Not on file       Transportation Needs:         ?  Lack of Transportation (Medical): Not on file     ?  Lack of Transportation (Non-Medical): Not on file       Physical Activity:         ?  Days of Exercise per Week: Not on file     ?  Minutes of Exercise per Session: Not on file       Stress:         ?  Feeling of Stress : Not on file       Social Connections:         ?  Frequency of Communication with Friends and Family: Not on file     ?  Frequency of Social Gatherings with Friends and Family: Not on file     ?  Attends Religious Services: Not on file     ?  Active Member of Clubs or Organizations: Not on file     ?  Attends Archivist Meetings: Not on file     ?  Marital Status: Not on file       Intimate Partner Violence:         ?  Fear of Current or Ex-Partner: Not on file     ?  Emotionally Abused: Not on file     ?  Physically Abused: Not on file     ?  Sexually Abused: Not on file       Housing Stability:         ?  Unable to Pay for Housing in the Last Year: Not on file     ?  Number of Places Lived in the Last Year: Not on file        ?  Unstable Housing in the Last Year: Not on file              ALLERGIES: Penicillins      Review of Systems    Constitutional: Negative for appetite change and fever.    HENT: Negative for congestion and rhinorrhea.     Eyes: Negative for discharge and redness.    Respiratory: Negative for cough and shortness of breath.     Cardiovascular: Negative for chest pain.    Gastrointestinal: Negative for abdominal pain, diarrhea, nausea and vomiting.    Genitourinary: Negative for decreased urine volume and dysuria.    Musculoskeletal: Negative for back pain.    Skin: Positive for wound. Negative for rash.    Neurological: Negative for seizures and headaches.    Hematological: Does not bruise/bleed easily.    Psychiatric/Behavioral: Negative for agitation.    All other systems reviewed and are  negative.           Vitals:           06/10/20 2201  06/10/20 2313         BP:  (!) 142/75       Pulse:  89       Resp:  20       Temp:  99.2 ??F (37.3 ??C)       SpO2:  98%  97%     Weight:  122.5 kg (270 lb)           Height:  '5\' 7"'  (1.702 m)                  Physical Exam   Vitals and nursing note reviewed.   Constitutional:        General: He is not in acute distress.     Appearance: He is well-developed. He is not diaphoretic.    HENT:       Head: Normocephalic and atraumatic.      Right Ear: External ear normal.      Left Ear: External ear normal.      Nose: Nose normal.   Eyes:       General:         Right eye: No discharge.         Left eye: No discharge.      Extraocular Movements: Extraocular movements intact.      Conjunctiva/sclera: Conjunctivae normal.      Pupils: Pupils are equal, round, and reactive  to light.    Cardiovascular:       Rate and Rhythm: Normal rate and regular rhythm.      Pulses: Normal pulses.      Heart sounds: Normal heart sounds. No murmur heard.   No friction rub. No gallop.     Pulmonary:       Effort: Pulmonary effort is normal. No respiratory distress.      Breath sounds: Normal breath sounds. No stridor. No wheezing or  rales.   Chest :       Chest wall: No tenderness.   Abdominal :      General: Bowel sounds are normal. There is no distension.      Palpations: Abdomen is soft. There is no mass.      Tenderness: There is no abdominal tenderness. There is no guarding or rebound.     Musculoskeletal:          General: No deformity. Normal range of motion.      Cervical back: Normal range of motion and neck supple.        Legs:       Skin:      General: Skin is warm and dry.      Capillary Refill: Capillary refill takes less than 2 seconds.      Findings: No rash.    Neurological:       General: No focal deficit present.      Mental Status: He is alert and oriented to person, place, and time.      Coordination: Coordination normal.   Psychiatric:         Behavior: Behavior normal.               MDM        Patient is a 30 year old male who presents today with large abscess present in the right inner thigh.   On exam, patient has a large abscess.  The abscess tender to palpation with swelling towards the perineal area and fluctuance.   Labs reviewed with elevations in his inflammatory markers.   Patient given a dose of clindamycin.   Patient actively draining in the ED.   CT abdomen pelvis ordered, negative for Fournier's gangrene or deep abscess.   The abscess was I&D.  Patient tolerated the procedure well.  Will be sent home with clindamycin.   The patient has been  re-evaluated and feeling much better and are stable for discharge.  All available radiology and laboratory results have been reviewed with patient and/or available family.  Patient and/or family verbally conveyed their understanding  and agreement of the patient's signs, symptoms, diagnosis, treatment and prognosis and additionally agree to follow-up as recommended in the discharge instructions or to return to the Emergency Department should their condition change or worsen prior  to their follow-up appointment.  All questions have been answered and patient and/or available family express understanding.        LABORATORY RESULTS:     Labs Reviewed       CBC WITH AUTOMATED DIFF - Abnormal; Notable for the following components:            Result  Value            WBC  15.3 (*)         NEUTROPHILS  80 (*)         LYMPHOCYTES  10 (*)         ABS. NEUTROPHILS  12.3 (*)         ABS. MONOCYTES  1.3 (*)         ABS. IMM. GRANS.  0.1 (*)            All other components within normal limits       METABOLIC PANEL, COMPREHENSIVE - Abnormal; Notable for the following components:            Glucose  103 (*)            All other components within normal limits       C REACTIVE PROTEIN, QT - Abnormal; Notable for the following components:            C-Reactive protein  7.05 (*)            All other components within normal limits       SED RATE  (ESR)           IMAGING RESULTS:     CT ABD PELV W CONT       Final Result     1.  No acute intra-abdominal pathology.            2.  Mild subcutaneous inflammation along the upper medial thigh of the right       lower extremity. No evidence of abscess or soft tissue gas.                  MEDICATIONS GIVEN:     Medications       lidocaine (PF) (XYLOCAINE) 10 mg/mL (1 %) injection 2 mL (has no administration in time range)     0.9% sodium chloride infusion 1,000 mL (0 mL IntraVENous IV Completed 06/11/20 0130)     morphine injection 4 mg (4 mg IntraVENous Given 06/10/20 2321)     iopamidoL (ISOVUE-370) 76 % injection 100 mL (100 mL IntraVENous Given 06/11/20 0035)     lidocaine (XYLOCAINE) 4 % cream ( Topical Given 06/11/20 0041)       clindamycin (CLEOCIN) 956m D5W 531mIVPB (premix) (0 mg IntraVENous IV Completed 06/11/20 0130)           IMPRESSION:      1.  Abscess            PLAN:     Follow-up Information               Follow up With  Specialties  Details  Why  Contact Info              [facility] EMERGENCY DEPT  Emergency Medicine  Go to   If symptoms worsen  [place]   [phone]                Discharge Medication List as of 06/11/2020  2:41 AM                     Stefan Church, MD            Please note that this dictation was completed with Dragon, the computer voice recognition software.  Quite often unanticipated grammatical, syntax, homophones, and other interpretive  errors are inadvertently transcribed by the computer software.  Please disregard these errors.  Please excuse any errors that have escaped final proofreading.               I&D Abcess Complex      Date/Time: 06/11/2020 4:31 AM   Performed by:  Stefan Church, MD   Authorized by:  Stefan Church, MD       Consent:      Consent obtained:  Verbal     Consent given by:  Patient     Risks discussed:  Incomplete drainage, infection, pain, bleeding and damage to other organs     Alternatives discussed:  No treatment    Location:      Type:  Abscess     Size:  3     Location:  Lower extremity   Pre-procedure details:      Skin preparation:  Betadine   Anesthesia (see MAR for exact dosages):      Anesthesia method:  Topical application and local infiltration     Topical anesthetic:  LET     Local anesthetic:  Lidocaine 1% WITH epi   Procedure type:      Complexity:  Complex   Procedure details:     Needle aspiration: no       Incision depth:  Dermal     Scalpel blade:  11     Wound management:  Probed and deloculated and irrigated with saline     Drainage:  Purulent     Drainage amount:  Moderate     Wound treatment:  Wound left open     Packing materials:  None   Post-procedure details:      Patient tolerance of procedure:  Tolerated well, no immediate complications

## 2021-11-12 ENCOUNTER — Encounter (HOSPITAL_BASED_OUTPATIENT_CLINIC_OR_DEPARTMENT_OTHER): Payer: Self-pay | Admitting: Emergency Medicine

## 2021-11-12 ENCOUNTER — Other Ambulatory Visit: Payer: Self-pay

## 2021-11-12 ENCOUNTER — Emergency Department (HOSPITAL_BASED_OUTPATIENT_CLINIC_OR_DEPARTMENT_OTHER)
Admission: EM | Admit: 2021-11-12 | Discharge: 2021-11-12 | Disposition: A | Discharge status: Home / Self Care | Payer: Medicaid Other | Attending: Emergency Medicine | Admitting: Emergency Medicine

## 2021-11-12 ENCOUNTER — Emergency Department (HOSPITAL_BASED_OUTPATIENT_CLINIC_OR_DEPARTMENT_OTHER): Payer: Medicaid Other

## 2021-11-12 DIAGNOSIS — J069 Acute upper respiratory infection, unspecified: Secondary | ICD-10-CM | POA: Insufficient documentation

## 2021-11-12 DIAGNOSIS — Z20822 Contact with and (suspected) exposure to covid-19: Secondary | ICD-10-CM | POA: Diagnosis not present

## 2021-11-12 DIAGNOSIS — R059 Cough, unspecified: Secondary | ICD-10-CM | POA: Diagnosis present

## 2021-11-12 LAB — RESP PANEL BY RT-PCR (FLU A&B, COVID) ARPGX2
Influenza A by PCR: NEGATIVE
Influenza B by PCR: NEGATIVE
SARS Coronavirus 2 by RT PCR: NEGATIVE

## 2021-11-12 MED ORDER — AZITHROMYCIN 250 MG PO TABS: 250.0000 mg | ORAL_TABLET | Freq: Every day | ORAL | 0 refills | Status: AC

## 2021-11-12 MED ORDER — ALBUTEROL SULFATE HFA 108 (90 BASE) MCG/ACT IN AERS
2.0000 | INHALATION_SPRAY | Freq: Once | RESPIRATORY_TRACT | Status: AC
  Administered 2021-11-12: 2 via RESPIRATORY_TRACT
  Filled 2021-11-12: qty 6.7

## 2021-11-12 MED ORDER — DEXAMETHASONE 4 MG PO TABS
10.0000 mg | ORAL_TABLET | Freq: Once | ORAL | Status: AC
  Administered 2021-11-12: 10 mg via ORAL
  Filled 2021-11-12: qty 3

## 2021-11-12 NOTE — Discharge Instructions (Signed)
Take antibiotic as prescribed.  Use albuterol inhaler 2 puffs every 4-6 hours as needed for cough.  You have already been treated with a long-acting steroid.  Please follow-up with your primary care doctor if symptoms worsen.  Return if needed as well.

## 2021-11-12 NOTE — ED Triage Notes (Signed)
Pt arrives pov, steady gait, endorses cough for several months. Denies fever

## 2021-11-12 NOTE — ED Provider Notes (Signed)
[facility] EMERGENCY DEPARTMENT Provider Note   CSN: [id number] Arrival date & time: 11/12/21  3338     History  Chief Complaint  Patient presents with   Cough    Grant Brown is a 31 y.o. male.  Patient here with cough for the past week.  Nothing makes it worse or better.  Has tried inhaler with no improvement.  History of cerebral palsy.  Denies any current fever or chills.  Family member sick with the same symptoms.  Denies any chest pain, sputum production, abdominal pain, nausea, vomiting, diarrhea.  The history is provided by the patient.       Home Medications Prior to Admission medications   Medication Sig Start Date End Date Taking? Authorizing Provider  azithromycin (ZITHROMAX) 250 MG tablet Take 1 tablet (250 mg total) by mouth daily. Take first 2 tablets together, then 1 every day until finished. 11/12/21  Yes Neli Fofana, DO  clonazePAM (KLONOPIN) 0.5 MG tablet Take 1 tablet (0.5 mg total) by mouth at bedtime as needed for anxiety. Tapering dose down 12/11/19   Rodolph Bong, MD  doxycycline (VIBRAMYCIN) 100 MG capsule Take 1 capsule (100 mg total) by mouth 2 (two) times daily. One po bid x 7 days 06/09/20   Molpus, John, MD  FLUoxetine (PROZAC) 40 MG capsule Take 1 capsule (40 mg total) by mouth daily for 7 days. 10/07/19 10/14/19  Rodolph Bong, MD  lactulose (CEPHULAC) 10 g packet Take 1 packet (10 g total) by mouth 3 (three) times daily. 02/09/19   Rolan Bucco, MD  polyethylene glycol (MIRALAX / GLYCOLAX) 17 g packet Take 1 packet with a full glass of water daily as needed for constipation. 06/09/20   Molpus, John, MD  polyethylene glycol powder (GLYCOLAX/MIRALAX) powder Take 17 g by mouth daily. 05/11/17   Mayo, Allyn Kenner, MD  senna-docusate (SENOKOT-S) 8.6-50 MG tablet Take 1 tablet by mouth daily. 05/11/17   Mayo, Allyn Kenner, MD  traZODone (DESYREL) 50 MG tablet Take 1 tablet (50 mg total) by mouth at bedtime as needed for sleep. 10/21/19   Rodolph Bong, MD      Allergies    Penicillins    Review of Systems   Review of Systems  Physical Exam Updated Vital Signs BP 134/81   Pulse 94   Temp 98.4 F (36.9 C) (Oral)   Resp 20   Ht 5\' 7"  (1.702 m)   Wt 117.9 kg   SpO2 97%   BMI 40.72 kg/m  Physical Exam Vitals and nursing note reviewed.  Constitutional:      General: He is not in acute distress.    Appearance: He is well-developed.  HENT:     Head: Normocephalic and atraumatic.     Nose: Nose normal.     Mouth/Throat:     Mouth: Mucous membranes are moist.  Eyes:     Extraocular Movements: Extraocular movements intact.     Conjunctiva/sclera: Conjunctivae normal.     Pupils: Pupils are equal, round, and reactive to light.  Cardiovascular:     Rate and Rhythm: Normal rate and regular rhythm.     Pulses: Normal pulses.     Heart sounds: Normal heart sounds. No murmur heard. Pulmonary:     Effort: Pulmonary effort is normal. No respiratory distress.     Breath sounds: Normal breath sounds.  Abdominal:     Palpations: Abdomen is soft.     Tenderness: There is no abdominal tenderness.  Musculoskeletal:  General: No swelling.     Cervical back: Neck supple.  Skin:    General: Skin is warm and dry.     Capillary Refill: Capillary refill takes less than 2 seconds.  Neurological:     Mental Status: He is alert.  Psychiatric:        Mood and Affect: Mood normal.     ED Results / Procedures / Treatments   Labs (all labs ordered are listed, but only abnormal results are displayed) Labs Reviewed  RESP PANEL BY RT-PCR (FLU A&B, COVID) [id number]    EKG None  Radiology DG Chest Portable 1 View  Result Date: 11/12/2021 CLINICAL DATA:  Cough with chest pain EXAM: PORTABLE CHEST 1 VIEW COMPARISON:  Chest x-ray July 26, 2018 FINDINGS: The cardiomediastinal silhouette is unchanged in contour. No focal pulmonary opacity. No pleural effusion or pneumothorax. Visualized upper abdomen is unremarkable. No acute osseous  abnormality. IMPRESSION: No acute cardiopulmonary abnormality. Electronically Signed   By: Jacob Moores M.D.   On: 11/12/2021 08:41    Procedures Procedures    Medications Ordered in ED Medications  dexamethasone (DECADRON) tablet 10 mg (has no administration in time range)  albuterol (VENTOLIN HFA) 108 (90 Base) MCG/ACT inhaler 2 puff (has no administration in time range)    ED Course/ Medical Decision Making/ A&P                           Medical Decision Making Amount and/or Complexity of Data Reviewed Radiology: ordered.  Risk Prescription drug management.   Grant Brown is here for cough.  Normal vitals.  No fever.  Vital type symptoms for the last week plus.  Family member with the same.  No significant medical history.  Differential diagnosis likely pneumonia versus viral process.  Will swab for COVID, flu, get chest x-ray.  We will give a dose of Decadron, albuterol for symptomatic support for cough.  Per my review and interpretation of chest x-ray there is no pneumonia pneumothorax.  We will treat with Z-Pak, continue use of albuterol at home.  Overall suspect postviral cough.  Discharged in good condition.  Understands return precautions.  This chart was dictated using voice recognition software.  Despite best efforts to proofread,  errors can occur which can change the documentation meaning.         Final Clinical Impression(s) / ED Diagnoses Final diagnoses:  Viral URI with cough    Rx / DC Orders ED Discharge Orders          Ordered    azithromycin (ZITHROMAX) 250 MG tablet  Daily        11/12/21 0859              Virgina Norfolk, DO 11/12/21 [phone]

## 2021-11-12 NOTE — ED Notes (Signed)
Pt discharged to home. Discharge instructions have been discussed with patient and/or family members. Pt verbally acknowledges understanding d/c instructions, and endorses comprehension to checkout at registration before leaving.  °

## 2021-11-12 NOTE — ED Notes (Signed)
ED Provider at bedside. 

## 2021-11-26 IMAGING — CT CT HEAD W/O CM
3 series · 15 of 47 positions shown, 18 images · non-contrast
Comparison: None

CLINICAL DATA: Head trauma, normal mental status.

EXAM:
CT HEAD WITHOUT CONTRAST
TECHNIQUE: Contiguous axial images were obtained from the base of the skull
through the vertex without intravenous contrast.

[Series 2: head wo · axial · 0.49mm/px · z∈[-163,-23]mm · 9 of 34 slices shown, 12 images]
[im 3/34  brain]
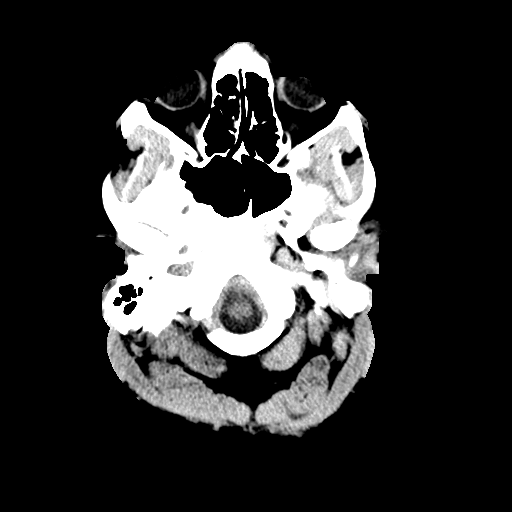
[im 3/34  bone]
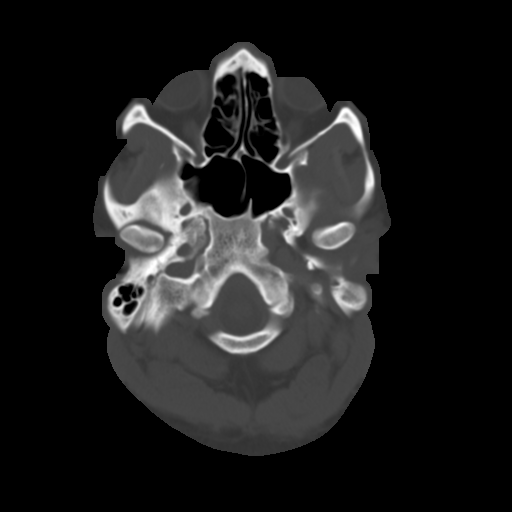
[im 6/34  brain]
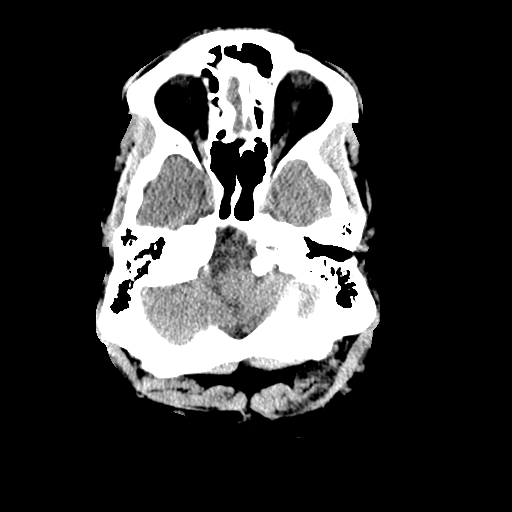
[im 10/34  brain]
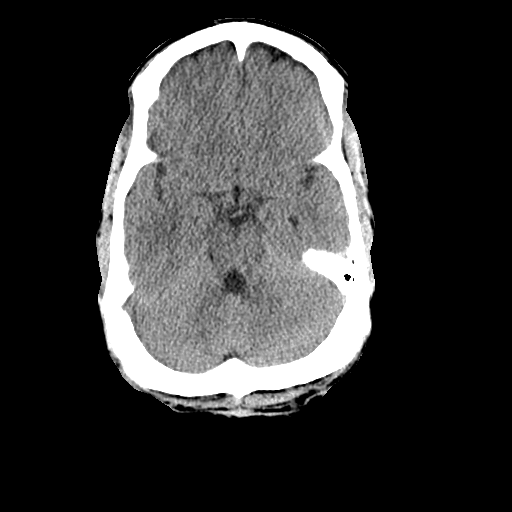
[im 13/34  brain]
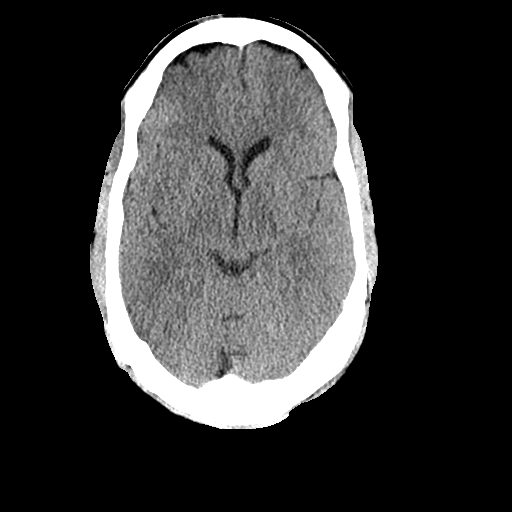
[im 18/34  brain]
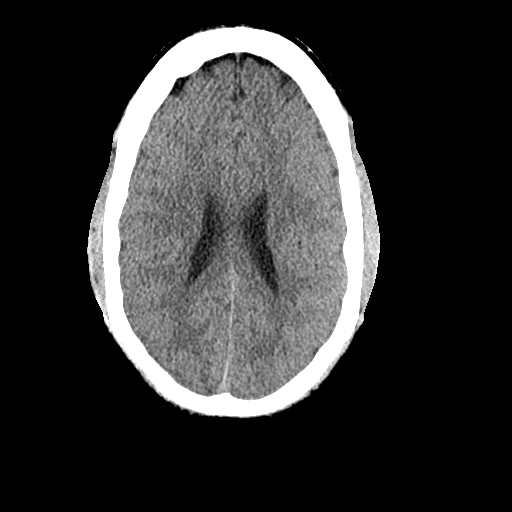
[im 18/34  bone]
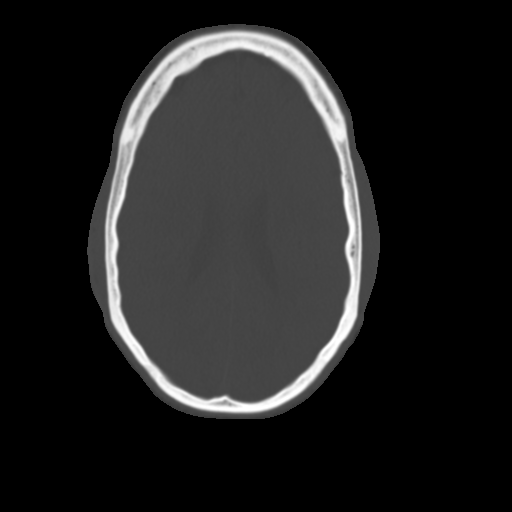
[im 21/34  brain]
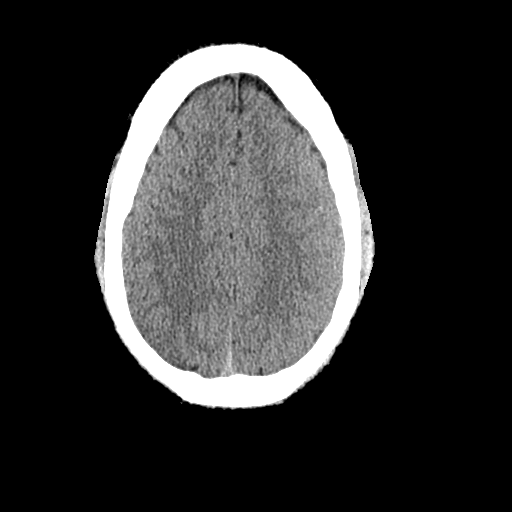
[im 24/34  brain]
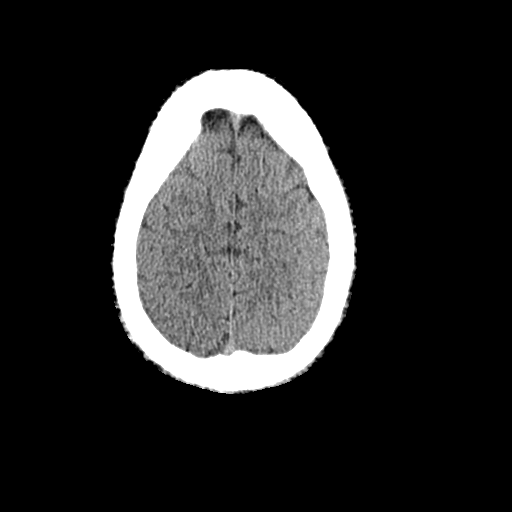
[im 28/34  brain]
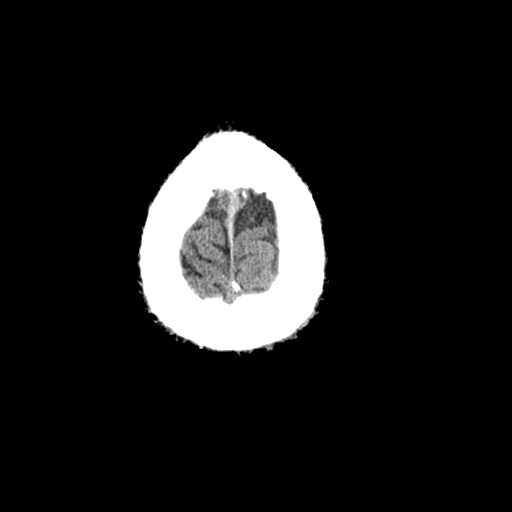
[im 31/34  brain]
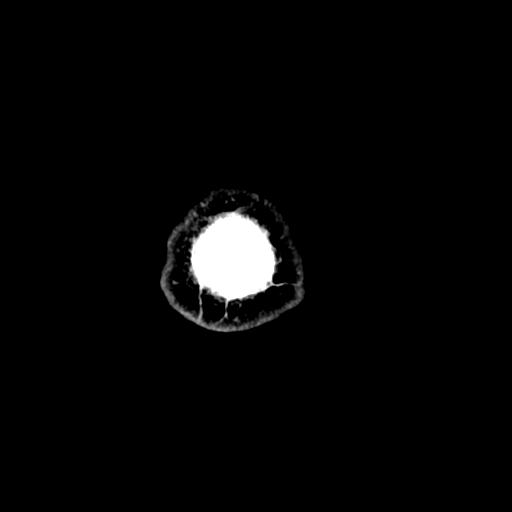
[im 31/34  bone]
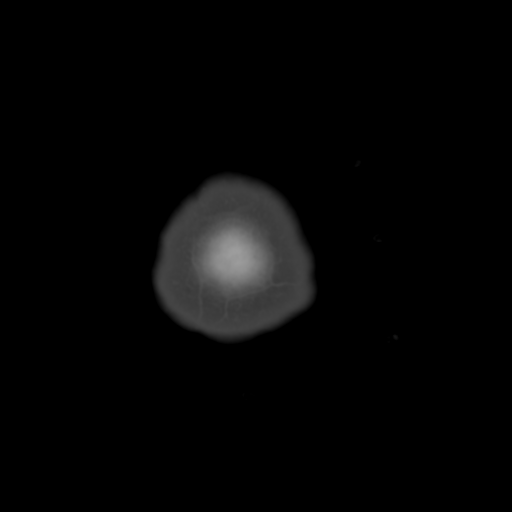

[Series 4: cor soft · coronal · 0.33mm/px · 3 of 73 slices shown]
[im 25/73  brain]
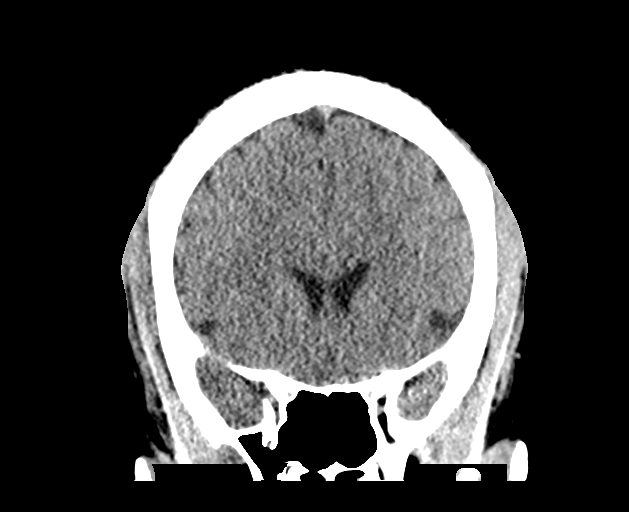
[im 33/73  brain]
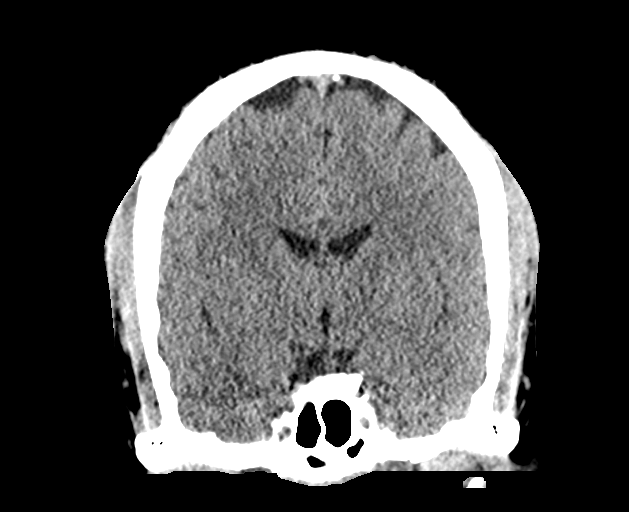
[im 41/73  brain]
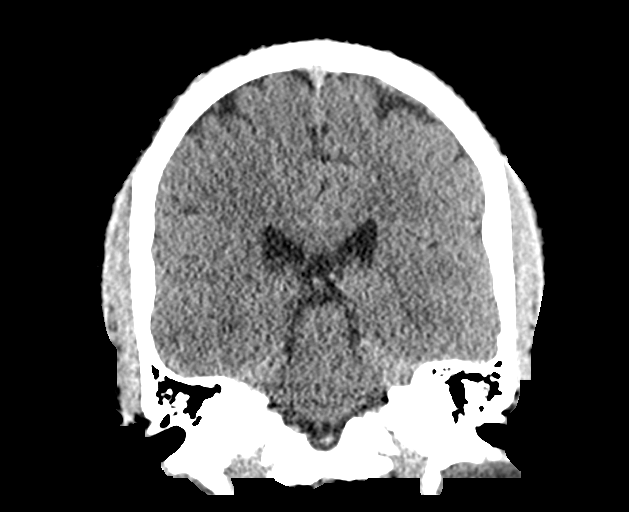

[Series 5: sag soft · sagittal · 0.33mm/px · 3 of 57 slices shown]
[im 19/57  brain]
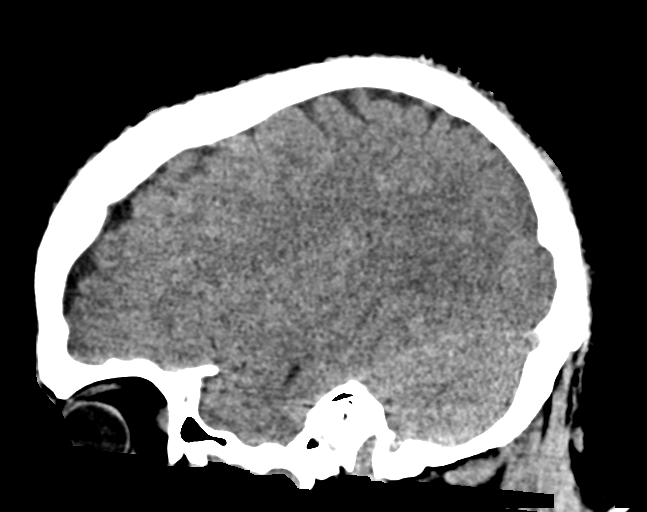
[im 29/57  brain]
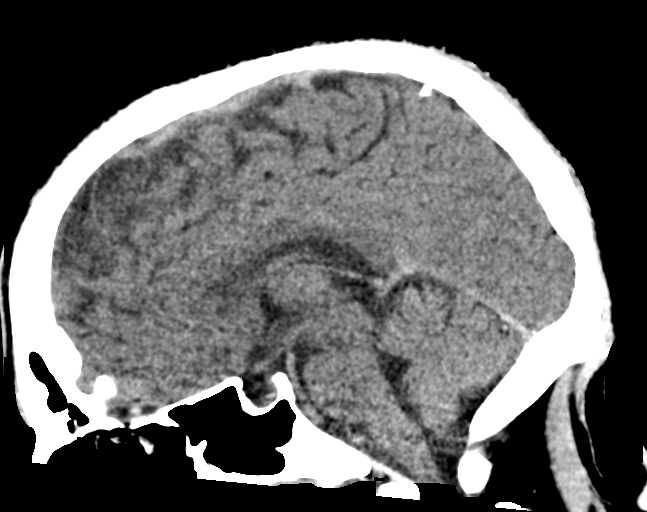
[im 38/57  brain]
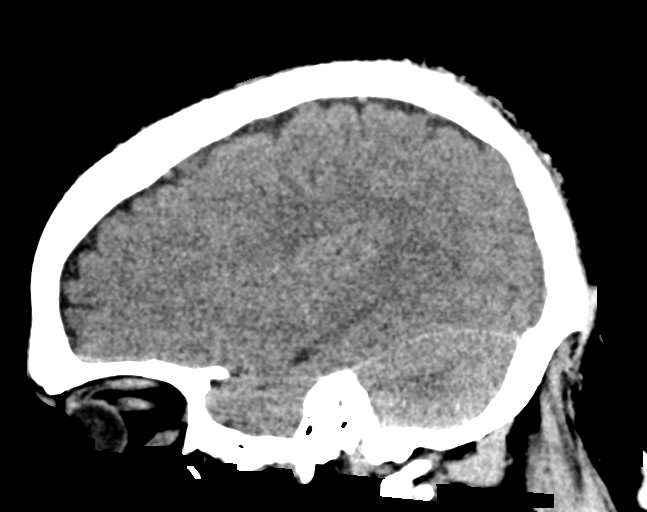

[15 of 47 positions shown; findings below may reference images not displayed]

FINDINGS: Brain: No evidence of acute infarction, hemorrhage, hydrocephalus,
extra-axial collection or mass lesion/mass effect.

Vascular: No hyperdense vessel or unexpected calcification.

Skull: Normal. Negative for fracture or focal lesion.

Sinuses/Orbits: Normal to the extent evaluated. Incompletely imaged
both orbits and paranasal sinuses.

Other: None.
IMPRESSION: No acute intracranial pathology.

## 2022-06-15 ENCOUNTER — Encounter (HOSPITAL_BASED_OUTPATIENT_CLINIC_OR_DEPARTMENT_OTHER): Payer: Self-pay | Admitting: Urology

## 2022-06-15 ENCOUNTER — Emergency Department (HOSPITAL_BASED_OUTPATIENT_CLINIC_OR_DEPARTMENT_OTHER): Payer: No Typology Code available for payment source

## 2022-06-15 ENCOUNTER — Emergency Department (HOSPITAL_BASED_OUTPATIENT_CLINIC_OR_DEPARTMENT_OTHER)
Admission: EM | Admit: 2022-06-15 | Discharge: 2022-06-15 | Disposition: A | Discharge status: Home / Self Care | Payer: No Typology Code available for payment source | Attending: Emergency Medicine | Admitting: Emergency Medicine

## 2022-06-15 DIAGNOSIS — W228XXA Striking against or struck by other objects, initial encounter: Secondary | ICD-10-CM | POA: Insufficient documentation

## 2022-06-15 DIAGNOSIS — S0990XA Unspecified injury of head, initial encounter: Secondary | ICD-10-CM | POA: Diagnosis not present

## 2022-06-15 DIAGNOSIS — S0511XA Contusion of eyeball and orbital tissues, right eye, initial encounter: Secondary | ICD-10-CM | POA: Insufficient documentation

## 2022-06-15 DIAGNOSIS — S0591XA Unspecified injury of right eye and orbit, initial encounter: Secondary | ICD-10-CM

## 2022-06-15 MED ORDER — IBUPROFEN 800 MG PO TABS
800.0000 mg | ORAL_TABLET | Freq: Once | ORAL | Status: AC
  Administered 2022-06-15: 800 mg via ORAL
  Filled 2022-06-15: qty 1

## 2022-06-15 MED ORDER — TETRACAINE HCL 0.5 % OP SOLN
2.0000 [drp] | Freq: Once | OPHTHALMIC | Status: AC
  Administered 2022-06-15: 2 [drp] via OPHTHALMIC
  Filled 2022-06-15: qty 4

## 2022-06-15 MED ORDER — FLUORESCEIN SODIUM 1 MG OP STRP
1.0000 | ORAL_STRIP | Freq: Once | OPHTHALMIC | Status: AC
  Administered 2022-06-15: 1 via OPHTHALMIC
  Filled 2022-06-15: qty 1
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
## 2022-06-15 NOTE — Discharge Instructions (Addendum)
You were seen in the ER for an eye injury.  Your CT scan today was reassuring. There was no evidence of fracture or compression of the compartments behind your eye.  I recommend using ice as needed for swelling. Please use acetaminophen (Tylenol) or ibuprofen (Advil, Motrin) for pain.  You may use 800 mg ibuprofen every 6 hours or 1000 mg of acetaminophen every 6 hours.  You may choose to alternate between the two, this would be most effective. Do not exceed 4000 mg of acetaminophen within 24 hours.  Do not exceed 3200 mg ibuprofen within 24 hours.  Continue to monitor how you're doing and return to the ER for new or worsening symptoms.
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
## 2023-12-04 ENCOUNTER — Encounter (HOSPITAL_BASED_OUTPATIENT_CLINIC_OR_DEPARTMENT_OTHER): Payer: Self-pay

## 2023-12-04 ENCOUNTER — Emergency Department (HOSPITAL_BASED_OUTPATIENT_CLINIC_OR_DEPARTMENT_OTHER)

## 2023-12-04 ENCOUNTER — Emergency Department (HOSPITAL_BASED_OUTPATIENT_CLINIC_OR_DEPARTMENT_OTHER)
Admission: EM | Admit: 2023-12-04 | Discharge: 2023-12-04 | Disposition: A | Discharge status: Home / Self Care | Attending: Psychology | Admitting: Psychology

## 2023-12-04 ENCOUNTER — Other Ambulatory Visit: Payer: Self-pay

## 2023-12-04 DIAGNOSIS — R0789 Other chest pain: Secondary | ICD-10-CM | POA: Diagnosis not present

## 2023-12-04 DIAGNOSIS — J069 Acute upper respiratory infection, unspecified: Secondary | ICD-10-CM | POA: Insufficient documentation

## 2023-12-04 DIAGNOSIS — Z79899 Other long term (current) drug therapy: Secondary | ICD-10-CM | POA: Insufficient documentation

## 2023-12-04 DIAGNOSIS — F17200 Nicotine dependence, unspecified, uncomplicated: Secondary | ICD-10-CM | POA: Diagnosis not present

## 2023-12-04 DIAGNOSIS — R059 Cough, unspecified: Secondary | ICD-10-CM | POA: Diagnosis present

## 2023-12-04 LAB — RESP PANEL BY RT-PCR (RSV, FLU A&B, COVID)  RVPGX2
Influenza A by PCR: NEGATIVE
Influenza B by PCR: NEGATIVE
Resp Syncytial Virus by PCR: NEGATIVE
SARS Coronavirus 2 by RT PCR: NEGATIVE

## 2023-12-04 MED ORDER — OXYMETAZOLINE HCL 0.05 % NA SOLN: 1.0000 | Freq: Two times a day (BID) | NASAL | 0 refills | Status: AC

## 2023-12-04 MED ORDER — GUAIFENESIN-CODEINE 100-10 MG/5ML PO SOLN: 5.0000 mL | Freq: Three times a day (TID) | ORAL | 0 refills | Status: AC | PRN

## 2023-12-04 MED ORDER — IPRATROPIUM-ALBUTEROL 0.5-2.5 (3) MG/3ML IN SOLN
3.0000 mL | Freq: Once | RESPIRATORY_TRACT | Status: AC
  Administered 2023-12-04: 3 mL via RESPIRATORY_TRACT
  Filled 2023-12-04: qty 3

## 2023-12-04 NOTE — ED Provider Notes (Signed)
 [place] EM[facility]GENCY DEPARTMENT AT [facility] Provider Note   CSN: [id number] Arrival date & time: 12/04/23  9262     Patient presents with: Cough   Grant Brown is a 33 y.o. 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 Patient has been having a feeling of soreness in his throat and abdomen from coughing so much.  He denies any productive cough or hemoptysis.  Denies any fevers or chills.  Patient states that he does smoke but stopped smoking about a week ago when he was having trouble breathing.  The breathing is worse at night.  Patient states that he does not have any history of asthma but has been using inhalers.  Patient states that he has been having trouble breathing for quite some time but is acutely worse in the last 4 days.  He denies being around any sick contacts.   HPI     Prior to Admission medications   Medication Sig Start Date End Date Taking? Authorizing Provider  guaiFENesin-codeine 100-10 MG/5ML syrup Take 5 mLs by mouth 3 (three) times daily as needed for cough. 12/04/23  Yes Elnor Savant A, DO  oxymetazoline (AFRIN NASAL SPRAY) 0.05 % nasal spray Place 1 spray into both nostrils 2 (two) times daily for 3 days. 12/04/23 12/07/23 Yes Elnor Savant A, DO  azithromycin  (ZITHROMAX ) 250 MG tablet Take 1 tablet (250 mg total) by mouth daily. Take first 2 tablets together, then 1 every day until finished. 11/12/21   Curatolo, Adam, DO  clonazePAM  (KLONOPIN ) 0.5 MG tablet Take 1 tablet (0.5 mg total) by mouth at bedtime as needed for anxiety. Tapering dose down 12/11/19   Corey, Evan S, MD  doxycycline  (VIBRAMYCIN ) 100 MG capsule Take 1 capsule (100 mg total) by mouth 2 (two) times daily. One po bid x 7 days 06/09/20   Molpus, John, MD  FLUoxetine  (PROZAC ) 40 MG capsule Take 1 capsule (40 mg total) by mouth daily for 7 days. 10/07/19  10/14/19  Joane Artist RAMAN, MD  lactulose  (CEPHULAC ) 10 g packet Take 1 packet (10 g total) by mouth 3 (three) times daily. 02/09/19   Lenor Hollering, MD  polyethylene glycol (MIRALAX  / GLYCOLAX ) 17 g packet Take 1 packet with a full glass of water daily as needed for constipation. 06/09/20   Molpus, John, MD  polyethylene glycol powder (GLYCOLAX /MIRALAX ) powder Take 17 g by mouth daily. 05/11/17   Mayo, Rockie Overly, MD  senna-docusate (SENOKOT-S) 8.6-50 MG tablet Take 1 tablet by mouth daily. 05/11/17   Mayo, Rockie Overly, MD  traZODone  (DESYREL ) 50 MG tablet Take 1 tablet (50 mg total) by mouth at bedtime as needed for sleep. 10/21/19   Joane Artist RAMAN, MD    Allergies: Penicillins    Review of Systems Noted in HPI  Updated Vital Signs BP (!) 152/97   Pulse 73   Temp 98.4 F (36.9 C) (Oral)   Resp 18   Wt 127 kg   SpO2 97%   BMI 43.85 kg/m   Physical Exam Constitutional:      General: He is not in acute distress.    Appearance: He is obese.  Cardiovascular:     Rate and Rhythm: Normal rate and regular rhythm.     Heart sounds: No murmur heard. Pulmonary:     Effort: No respiratory distress.     Breath sounds: Wheezing  present.  Chest:     Chest wall: Tenderness present.  Abdominal:     General: There is no distension.     Palpations: Abdomen is soft.     Tenderness: There is no abdominal tenderness.  Neurological:     Mental Status: He is alert.     (all labs ordered are listed, but only abnormal results are displayed) Labs Reviewed  RESP PANEL BY RT-PCR (RSV, FLU A&B, COVID)  [id number]    EKG: None  Radiology: DG Chest 2 View Result Date: 12/04/2023 EXAM: 2 VIEW(S) XRAY OF THE CHEST 12/04/2023 08:12:00 AM COMPARISON: Chest radiographs 11/12/2021 and earlier. CLINICAL HISTORY: 33 year old male with cough. FINDINGS: LUNGS AND PLEURA: No focal pulmonary opacity. No pleural effusion. No pneumothorax. HEART AND MEDIASTINUM: No abnormality of the cardiac and mediastinal silhouettes.  BONES AND SOFT TISSUES: No acute osseous abnormality. Negative visible bowel gas. IMPRESSION: 1. No acute cardiopulmonary abnormality. Electronically signed by: Helayne Hurst MD 12/04/2023 08:22 AM EST RP Workstation: [id number]     Procedures   Medications Ordered in the ED  ipratropium-albuterol  (DUONEB) 0.5-2.5 (3) MG/3ML nebulizer solution 3 mL (3 mLs Nebulization Given 12/04/23 0815)    Clinical Course as of 12/04/23 0853  Tue Dec 04, 2023  0841 XR/swabs neg [SG]  9150 Well appearing, nontoxic, no hypoxia. Symptoms improved w/ duoneb, primary complaint is cough.  CXR/RVP neg. Lungs clear. Give anti-tussive for home. Rehydration. Supportive care. Advised f/u pcp for recheck.  [SG]    Clinical Course User Index [SG] Elnor Jayson LABOR, DO                                 Medical Decision Making Amount and/or Complexity of Data Reviewed Radiology: ordered.  Risk Prescription drug management.   Differentials include viral URI, asthma, GERD, CAP, COPD , ACE inhibitor induced cough, PE, chf   Initial impression: Vital signs are stable and patient is on room air.  Likely viral URI.  Will rule out flu and COVID.  Patient did have some expiratory wheezes on exam so we will give him some DuoNebs.  Will order chest x-ray for any other acute etiology which shortness of breath could be pneumonia or pneumothorax.  Final impression: COVID, Flu, RSV is negative. CXR is clear. Likely viral URI.Will send patient home with robitussin codeine to help with cough and sleep. Return precautions given. Supportive care with fluids      Final diagnoses:  Viral URI with cough    ED Discharge Orders          Ordered    guaiFENesin-codeine 100-10 MG/5ML syrup  3 times daily PRN        12/04/23 0848    oxymetazoline (AFRIN NASAL SPRAY) 0.05 % nasal spray  2 times daily        12/04/23 0848               D'Mello, Tayler Heiden, DO 12/04/23 0853    Elnor Jayson LABOR, DO 12/05/23 [phone]

## 2023-12-04 NOTE — ED Triage Notes (Signed)
 Cough x 4 days. Worse at night. Difficulty catching breath with coughing. Chest soreness and head still hurts from concussion. Endorsed, nasal  congestion, drainage and sore throat

## 2023-12-04 NOTE — Discharge Instructions (Addendum)
 It was a pleasure caring for you today in the emergency department.  You have been seen in the Emergency Department (ED) today for a likely viral illness.  Please drink plenty of clear fluids (water, Gatorade, chicken broth, etc).  You may use Tylenol  and/or Motrin  according to label instructions.  You can alternate between the two without any side effects.   Please follow up with your doctor as listed above.  Call your doctor or return to the Emergency Department (ED) if you are unable to tolerate fluids due to vomiting, have worsening trouble breathing, become extremely tired or difficult to awaken, or if you develop any other symptoms that concern you.

## 2024-01-01 ENCOUNTER — Emergency Department (HOSPITAL_BASED_OUTPATIENT_CLINIC_OR_DEPARTMENT_OTHER): Admission: EM | Admit: 2024-01-01 | Discharge: 2024-01-01 | Disposition: A | Discharge status: Home / Self Care

## 2024-01-01 ENCOUNTER — Emergency Department (HOSPITAL_BASED_OUTPATIENT_CLINIC_OR_DEPARTMENT_OTHER)

## 2024-01-01 ENCOUNTER — Other Ambulatory Visit: Payer: Self-pay

## 2024-01-01 DIAGNOSIS — R059 Cough, unspecified: Secondary | ICD-10-CM | POA: Diagnosis present

## 2024-01-01 DIAGNOSIS — R0602 Shortness of breath: Secondary | ICD-10-CM | POA: Diagnosis not present

## 2024-01-01 DIAGNOSIS — R052 Subacute cough: Secondary | ICD-10-CM | POA: Insufficient documentation

## 2024-01-01 DIAGNOSIS — J40 Bronchitis, not specified as acute or chronic: Secondary | ICD-10-CM

## 2024-01-01 DIAGNOSIS — Z87891 Personal history of nicotine dependence: Secondary | ICD-10-CM | POA: Insufficient documentation

## 2024-01-01 MED ORDER — PREDNISONE 20 MG PO TABS: 40.0000 mg | ORAL_TABLET | Freq: Every day | ORAL | 0 refills | Status: AC

## 2024-01-01 MED ORDER — ALBUTEROL SULFATE (2.5 MG/3ML) 0.083% IN NEBU
INHALATION_SOLUTION | RESPIRATORY_TRACT | Status: AC
  Filled 2024-01-01: qty 3

## 2024-01-01 MED ORDER — ALBUTEROL SULFATE HFA 108 (90 BASE) MCG/ACT IN AERS
2.0000 | INHALATION_SPRAY | Freq: Once | RESPIRATORY_TRACT | Status: AC
  Administered 2024-01-01: 2 via RESPIRATORY_TRACT
  Filled 2024-01-01: qty 6.7

## 2024-01-01 MED ORDER — DEXAMETHASONE 4 MG PO TABS
10.0000 mg | ORAL_TABLET | Freq: Once | ORAL | Status: AC
  Administered 2024-01-01: 10 mg via ORAL
  Filled 2024-01-01: qty 3

## 2024-01-01 MED ORDER — IPRATROPIUM-ALBUTEROL 0.5-2.5 (3) MG/3ML IN SOLN
3.0000 mL | Freq: Once | RESPIRATORY_TRACT | Status: AC
  Administered 2024-01-01: 3 mL via RESPIRATORY_TRACT
  Filled 2024-01-01: qty 3

## 2024-01-01 MED ORDER — ALBUTEROL SULFATE (2.5 MG/3ML) 0.083% IN NEBU
2.5000 mg | INHALATION_SOLUTION | Freq: Once | RESPIRATORY_TRACT | Status: AC
  Administered 2024-01-01: 2.5 mg via RESPIRATORY_TRACT

## 2024-01-01 NOTE — ED Triage Notes (Signed)
 Pt seen 12/2 for cough which has gradually improved Took meds ordered , but cough has been coughing the past 2 weeks . Cough is forceful and making it difficult to sleep  Denies SOB

## 2024-01-01 NOTE — ED Provider Notes (Signed)
 "  EMERGENCY DEPARTMENT AT [facility] Provider Note   CSN: [id number] Arrival date & time: 01/01/24  9164     Patient presents with: Cough   Grant Brown is a 33 y.o. male.   This is a 33 year old male presenting emergency department with cough.  Reports he has had seemingly a chronic cough for the past year, but acutely worsened earlier this month after he had a viral URI.  Has not improved typically worse at night.  Some intermittent shortness of breath, but not having any currently.  No chest pain.  He recently stopped smoking a few months ago.  Denies history of lung disease   Cough      Prior to Admission medications  Medication Sig Start Date End Date Taking? Authorizing Provider  azithromycin  (ZITHROMAX ) 250 MG tablet Take 1 tablet (250 mg total) by mouth daily. Take first 2 tablets together, then 1 every day until finished. 11/12/21   Curatolo, Adam, DO  clonazePAM  (KLONOPIN ) 0.5 MG tablet Take 1 tablet (0.5 mg total) by mouth at bedtime as needed for anxiety. Tapering dose down 12/11/19   Corey, Evan S, MD  doxycycline  (VIBRAMYCIN ) 100 MG capsule Take 1 capsule (100 mg total) by mouth 2 (two) times daily. One po bid x 7 days 06/09/20   Molpus, John, MD  FLUoxetine  (PROZAC ) 40 MG capsule Take 1 capsule (40 mg total) by mouth daily for 7 days. 10/07/19 10/14/19  Joane Artist RAMAN, MD  guaiFENesin -codeine  100-10 MG/5ML syrup Take 5 mLs by mouth 3 (three) times daily as needed for cough. 12/04/23   Elnor Jayson LABOR, DO  lactulose  (CEPHULAC ) 10 g packet Take 1 packet (10 g total) by mouth 3 (three) times daily. 02/09/19   Lenor Hollering, MD  polyethylene glycol (MIRALAX  / GLYCOLAX ) 17 g packet Take 1 packet with a full glass of water daily as needed for constipation. 06/09/20   Molpus, John, MD  polyethylene glycol powder (GLYCOLAX /MIRALAX ) powder Take 17 g by mouth daily. 05/11/17   Mayo, Rockie Overly, MD  senna-docusate (SENOKOT-S) 8.6-50 MG tablet Take 1 tablet by mouth  daily. 05/11/17   Mayo, Rockie Overly, MD  traZODone  (DESYREL ) 50 MG tablet Take 1 tablet (50 mg total) by mouth at bedtime as needed for sleep. 10/21/19   Joane Artist RAMAN, MD    Allergies: Penicillins    Review of Systems  Respiratory:  Positive for cough.     Updated Vital Signs BP (!) 135/90 (BP Location: Right Arm)   Pulse 88   Temp 98.2 F (36.8 C) (Oral)   Resp 19   Ht 5' 7 (1.702 m)   Wt 127 kg   SpO2 98%   BMI 43.85 kg/m   Physical Exam Vitals and nursing note reviewed.  Constitutional:      General: He is not in acute distress.    Appearance: He is not toxic-appearing.  HENT:     Head: Normocephalic.     Nose: Nose normal.  Eyes:     Conjunctiva/sclera: Conjunctivae normal.  Cardiovascular:     Rate and Rhythm: Normal rate and regular rhythm.  Pulmonary:     Effort: Pulmonary effort is normal.     Comments: Diminished lung sounds. Abdominal:     General: Abdomen is flat. There is no distension.     Palpations: Abdomen is soft.  Musculoskeletal:     Right lower leg: No edema.     Left lower leg: No edema.  Skin:    General:  Skin is warm.     Capillary Refill: Capillary refill takes less than 2 seconds.  Neurological:     Mental Status: He is alert and oriented to person, place, and time.  Psychiatric:        Mood and Affect: Mood normal.        Behavior: Behavior normal.     (all labs ordered are listed, but only abnormal results are displayed) Labs Reviewed - No data to display  EKG: None  Radiology: DG Chest 2 View Result Date: 01/01/2024 CLINICAL DATA:  Cough EXAM: CHEST - 2 VIEW COMPARISON:  December 04, 2023 FINDINGS: The heart size and mediastinal contours are within normal limits. Both lungs are clear. The visualized skeletal structures are unremarkable. IMPRESSION: No active cardiopulmonary disease. Electronically Signed   By: Lynwood Landy Raddle M.D.   On: 01/01/2024 11:56     Procedures   Medications Ordered in the ED  albuterol  (PROVENTIL )  (2.5 MG/3ML) 0.083% nebulizer solution 2.5 mg (2.5 mg Nebulization Given 01/01/24 0947)  albuterol  (PROVENTIL ) (2.5 MG/3ML) 0.083% nebulizer solution (  Given by Other 01/01/24 0952)  ipratropium-albuterol  (DUONEB) 0.5-2.5 (3) MG/3ML nebulizer solution 3 mL (3 mLs Nebulization Given 01/01/24 1031)  dexamethasone  (DECADRON ) tablet 10 mg (10 mg Oral Given 01/01/24 1031)    Clinical Course as of 01/01/24 1205  Tue Jan 01, 2024  1201 DG Chest 2 View IMPRESSION: No active cardiopulmonary disease.   Electronically Signed   [TY]  1202 Patient notes significant improvement in his cough and feels that he is breathing better.  No diagnosed history of COPD or asthma, but does have smoking history.  Will give albuterol  inhaler and short course of steroids.  Follow-up with PCP.  Return precautions given. [TY]    Clinical Course User Index [TY] Neysa Caron PARAS, DO                                 Medical Decision Making This is a 33 year old male with obesity, former smoker presenting emergency department with subacute cough.  Afebrile nontachycardic maintaining ox saturation on room air.  Not in distress.  Did have some diminished breath sounds with poor air movement.  Trialed breathing treatments with improvement of symptoms.  Given Decadron .  See ED course; discharging in stable condition.  Amount and/or Complexity of Data Reviewed Radiology: ordered and independent interpretation performed. Decision-making details documented in ED Course.    Details: Chest x-ray without pneumonia or pneumothorax my independent review  Risk Prescription drug management.      Final diagnoses:  None    ED Discharge Orders     None          Neysa Caron PARAS, DO 01/01/24 1205  "

## 2024-01-01 NOTE — Discharge Instructions (Addendum)
 Use the inhaler as needed.  Take the steroids for the full course.  Please follow-up with your primary doctor.  Return for fevers, chills, chest pain, shortness of breath, difficulty breathing, lightheadedness, passout or any new or worsening symptoms that are concerning to you.
